# Patient Record
Sex: Female | Born: 1955 | Race: Black or African American | Hispanic: No | Marital: Single | Smoking: Never smoker
Health system: Southern US, Community
[De-identification: ages and names within clinical notes are randomized; demographics above are authoritative.]

## PROBLEM LIST (undated history)

## (undated) DIAGNOSIS — E669 Obesity, unspecified: Secondary | ICD-10-CM

## (undated) DIAGNOSIS — T7840XA Allergy, unspecified, initial encounter: Secondary | ICD-10-CM

## (undated) DIAGNOSIS — J849 Interstitial pulmonary disease, unspecified: Secondary | ICD-10-CM

## (undated) DIAGNOSIS — I10 Essential (primary) hypertension: Secondary | ICD-10-CM

## (undated) HISTORY — DX: Obesity, unspecified: E66.9

## (undated) HISTORY — PX: BUNIONECTOMY: SHX129

## (undated) HISTORY — DX: Essential (primary) hypertension: I10

## (undated) HISTORY — PX: OTHER SURGICAL HISTORY: SHX169

## (undated) HISTORY — DX: Interstitial pulmonary disease, unspecified: J84.9

## (undated) HISTORY — DX: Allergy, unspecified, initial encounter: T78.40XA

## (undated) HISTORY — PX: TUBAL LIGATION: SHX77

---
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
## 1998-06-30 ENCOUNTER — Other Ambulatory Visit: Admission: RE | Admit: 1998-06-30 | Discharge: 1998-06-30 | Payer: Self-pay | Admitting: Family Medicine

## 2005-03-04 ENCOUNTER — Other Ambulatory Visit: Admission: RE | Admit: 2005-03-04 | Discharge: 2005-03-04 | Payer: Self-pay | Admitting: Family Medicine

## 2006-08-05 ENCOUNTER — Other Ambulatory Visit: Admission: RE | Admit: 2006-08-05 | Discharge: 2006-08-05 | Payer: Self-pay | Admitting: Family Medicine

## 2007-07-06 HISTORY — PX: COLONOSCOPY: SHX174

## 2007-07-06 HISTORY — PX: LUNG BIOPSY: SHX232

## 2007-09-14 ENCOUNTER — Other Ambulatory Visit: Admission: RE | Admit: 2007-09-14 | Discharge: 2007-09-14 | Payer: Self-pay | Admitting: Family Medicine

## 2007-12-16 ENCOUNTER — Ambulatory Visit: Payer: Self-pay | Admitting: Pulmonary Disease

## 2007-12-16 ENCOUNTER — Ambulatory Visit: Payer: Self-pay | Admitting: Cardiology

## 2007-12-16 ENCOUNTER — Inpatient Hospital Stay (HOSPITAL_COMMUNITY): Admission: EM | Admit: 2007-12-16 | Discharge: 2007-12-30 | Payer: Self-pay | Admitting: Emergency Medicine

## 2007-12-18 ENCOUNTER — Encounter (INDEPENDENT_AMBULATORY_CARE_PROVIDER_SITE_OTHER): Payer: Self-pay | Admitting: Internal Medicine

## 2007-12-20 ENCOUNTER — Encounter: Payer: Self-pay | Admitting: Pulmonary Disease

## 2007-12-22 ENCOUNTER — Ambulatory Visit: Payer: Self-pay | Admitting: Thoracic Surgery

## 2007-12-25 ENCOUNTER — Encounter: Payer: Self-pay | Admitting: Thoracic Surgery

## 2007-12-25 ENCOUNTER — Ambulatory Visit: Payer: Self-pay | Admitting: Internal Medicine

## 2008-01-04 ENCOUNTER — Encounter: Admission: RE | Admit: 2008-01-04 | Discharge: 2008-01-04 | Payer: Self-pay | Admitting: Thoracic Surgery

## 2008-01-04 ENCOUNTER — Ambulatory Visit: Payer: Self-pay | Admitting: Thoracic Surgery

## 2008-01-08 ENCOUNTER — Telehealth: Payer: Self-pay | Admitting: Pulmonary Disease

## 2008-01-09 ENCOUNTER — Ambulatory Visit: Payer: Self-pay | Admitting: Pulmonary Disease

## 2008-01-09 DIAGNOSIS — I10 Essential (primary) hypertension: Secondary | ICD-10-CM | POA: Insufficient documentation

## 2008-01-09 DIAGNOSIS — J8409 Other alveolar and parieto-alveolar conditions: Secondary | ICD-10-CM | POA: Insufficient documentation

## 2008-01-18 ENCOUNTER — Ambulatory Visit: Payer: Self-pay | Admitting: Pulmonary Disease

## 2008-01-23 ENCOUNTER — Encounter: Payer: Self-pay | Admitting: Pulmonary Disease

## 2008-01-24 ENCOUNTER — Encounter: Admission: RE | Admit: 2008-01-24 | Discharge: 2008-01-24 | Payer: Self-pay | Admitting: Thoracic Surgery

## 2008-01-24 ENCOUNTER — Ambulatory Visit: Payer: Self-pay | Admitting: Pulmonary Disease

## 2008-01-24 ENCOUNTER — Ambulatory Visit: Payer: Self-pay | Admitting: Thoracic Surgery

## 2008-02-14 ENCOUNTER — Telehealth (INDEPENDENT_AMBULATORY_CARE_PROVIDER_SITE_OTHER): Payer: Self-pay | Admitting: *Deleted

## 2008-02-15 ENCOUNTER — Encounter: Payer: Self-pay | Admitting: Pulmonary Disease

## 2008-02-27 ENCOUNTER — Ambulatory Visit: Payer: Self-pay | Admitting: Pulmonary Disease

## 2008-04-17 ENCOUNTER — Encounter: Payer: Self-pay | Admitting: Internal Medicine

## 2008-04-19 ENCOUNTER — Ambulatory Visit: Payer: Self-pay | Admitting: Pulmonary Disease

## 2008-08-21 ENCOUNTER — Encounter: Payer: Self-pay | Admitting: Internal Medicine

## 2008-11-28 ENCOUNTER — Ambulatory Visit: Payer: Self-pay | Admitting: Surgery

## 2008-11-28 ENCOUNTER — Ambulatory Visit (HOSPITAL_COMMUNITY): Admission: RE | Admit: 2008-11-28 | Discharge: 2008-11-28 | Payer: Self-pay | Admitting: Rheumatology

## 2008-11-28 ENCOUNTER — Encounter (INDEPENDENT_AMBULATORY_CARE_PROVIDER_SITE_OTHER): Payer: Self-pay | Admitting: Rheumatology

## 2008-11-29 ENCOUNTER — Encounter: Admission: RE | Admit: 2008-11-29 | Discharge: 2008-11-29 | Payer: Self-pay | Admitting: Internal Medicine

## 2008-12-27 ENCOUNTER — Other Ambulatory Visit: Admission: RE | Admit: 2008-12-27 | Discharge: 2008-12-27 | Payer: Self-pay | Admitting: Family Medicine

## 2009-09-22 ENCOUNTER — Ambulatory Visit: Payer: Self-pay | Admitting: Family Medicine

## 2010-07-26 ENCOUNTER — Encounter: Payer: Self-pay | Admitting: Thoracic Surgery

## 2010-08-06 ENCOUNTER — Ambulatory Visit (INDEPENDENT_AMBULATORY_CARE_PROVIDER_SITE_OTHER): Payer: BC Managed Care – PPO | Admitting: Family Medicine

## 2010-08-06 DIAGNOSIS — I10 Essential (primary) hypertension: Secondary | ICD-10-CM

## 2010-08-06 DIAGNOSIS — Z79899 Other long term (current) drug therapy: Secondary | ICD-10-CM

## 2010-08-06 DIAGNOSIS — Z23 Encounter for immunization: Secondary | ICD-10-CM

## 2010-11-17 NOTE — Consult Note (Signed)
Jocelyn Bond, RALLIS          ACCOUNT NO.:  [id number]   MEDICAL RECORD NO.:  [id number]          PATIENT TYPE:  EMS   LOCATION:  ED                           FACILITY:  [facility]   PHYSICIAN:  Jonelle Sidle, MD DATE OF BIRTH:  July 06, 1955   DATE OF CONSULTATION:  12/16/2007  DATE OF DISCHARGE:                                 CONSULTATION   PRIMARY CARE PHYSICIAN:  Jethro Bastos, M.D. with [facility].   REASON FOR CONSULTATION:  Premature ventricular complexes and recent  episode of syncope.   HISTORY OF PRESENT ILLNESS:  Ms. Jocelyn Bond is a pleasant 55 year old  woman with a history of hypertension treated with medications outlined  below.  She states that she has been in her usual state of health except  that over the last few weeks she has been experiencing an intermittent  cough productive of thick white sputum, no obvious fevers or chills  but a general decrease in energy and relative anorexia.  She has been  using Mucinex and Tussin over-the-counter.  She states that she awoke  this morning to do her usual paper route and felt lightheaded when she  stood up.  This persisted but she went on into the kitchen and sat on a  barstool to eat some chips.  While she was seated there her daughter  apparently heard for her moaning and went in to check on her.  It was  noted that she was lying back with her head on the counter and not  responding to direct questions with some mild shaking of her hands.  Her daughter spoke to her and shook her some and within 30 seconds she  was reportedly back to normal.  Jocelyn Bond does not recall this  situation directly although does not remember having any associated  chest pain or palpitations preceding the event and now feels back to  normal.  She was taken to the [facility] emergency department and it  was noted that she had premature ventricular complexes on telemetry  monitoring including some couplets but no  sustained arrhythmias.  These  were asymptomatic.  Her 12-lead electrocardiogram is pending at this  time.  Available rhythm strip that I have shows no acute ST changes in  leads 1, 2 or 3.  She denies any personal history of cardiovascular  disease or dysrhythmia.  She has no typical exertional chest pain or  limiting dyspnea on any regular basis.  She was actually given a dose of  amiodarone in the emergency department prior to our assessment.  In  reviewing the situation further I see that she has a chest x-ray  demonstrating asymmetric air space disease, right greater than left,  suggestive of possible pneumonia.  She also has a leukocytosis with a  white blood cell count of 16 and somewhat of a leftward shift.  Her  potassium is low at 3.4 and her creatinine is mildly elevated at 1.3.  Point of care cardiac markers are entirely normal including a BNP level.   ALLERGIES:  No known drug allergies.   MEDICATIONS AT HOME:  1. Include Norvasc  5 mg p.o. daily.  2. Hydrochlorothiazide 25 mg p.o. daily.  3. Potassium chloride 40 mEq p.o. daily.   PAST MEDICAL HISTORY:  Includes hypertension.  The patient denies any  problems with hyperlipidemia, thyroid disease, diabetes mellitus or  cardiovascular disease.   FAMILY HISTORY:  Is reviewed and is significant for hypertension,  although no premature cardiovascular disease, dysrhythmia or sudden  cardiac death.   SOCIAL HISTORY:  The patient denies any tobacco or alcohol use.  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   REVIEW OF SYSTEMS:  As outlined above.  Otherwise negative.   PHYSICAL EXAMINATION:  VITAL SIGNS:  On examination blood pressure is  110/80, heart rate is in the 90s in sinus rhythm with occasional  premature ventricular complexes.  She is not hypoxic.  Respirations 18  and nonlabored.  GENERAL:  This is a normally nourished appearing woman in no  acute  distress.  HEENT:  Conjunctivae normal.  Oropharynx is clear.  NECK:  Neck is supple.  No elevated jugular venous pressure.  No loud  bruits.  No thyromegaly.  LUNGS:  Exhibit egophony essentially in the right mid base with crackles  in this region, less noted in the left base.  There is a slight  expiratory wheeze on the right.  CARDIAC:  Reveals a regular rate and rhythm with occasional ectopic  beat.  No S3 gallop, pericardial rub or loud systolic murmur.  ABDOMEN:  Soft, nontender, normoactive bowel sounds.  No guarding.  EXTREMITIES:  Exhibit no frank pitting edema.  Distal pulses are 2+.  SKIN:  Skin is warm and dry.  MUSCULOSKELETAL:  No kyphosis noted.  NEUROPSYCHIATRIC:  The patient is alert and oriented x3.  Affect is  appropriate.   LABORATORY DATA:  WBC 16.0, hemoglobin 10.8, hematocrit 32.4, platelets  295, sodium 138, potassium 3.4, chloride 101, glucose 123, BUN 10,  creatinine 1.3, CK-MB 1.4, troponin-I less than 0.05.  BNP 35.  Urinalysis with trace leukocytes, many squamous epithelial cells  suggesting contamination, 30 protein, no bacteria.   IMPRESSION:  1. Recent brief episode of apparent syncope while seated preceded by a      feeling of lightheadedness on standing.  This raises the      possibility of general orthostasis particularly with the patient's      concurrent pneumonia and relative anorexia over the last few weeks.      It may well be that she has been somewhat dehydrated also reflected      in her mild increase in creatinine.  Having said that she does have      some ventricular ectopy noted although nothing sustained.  Her BNP      would argue against any obvious congestive heart failure as a      component.  She has no personal history of cardiovascular disease      or myopathy.  2. History of hypertension, on Norvasc, hydrochlorothiazide and      potassium supplementation.  3. Apparent pneumonia with right greater than left-sided  infiltrates,      leukocytosis and a 2 week history of mildly productive cough.   RECOMMENDATIONS:  I spoke with the [facility] hospitalist on call today.  It  would seem reasonable for the patient to be admitted to telemetry not  only for initial management of her pneumonia but also closer followup of  her heart rhythm.  We can also obtain serial cardiac markers, a TSH,  check orthostatics and check a 2-D echocardiogram to  exclude any structural cardiac abnormalities.  Followup  electrocardiogram can also be obtained.  Our service will plan to follow  with you to determine if any additional cardiac studies are needed.  I  would not continue amiodarone.  Otherwise no other specific cardiac  medications are added at this point.      Jonelle Sidle, MD  Electronically Signed     SGM/MEDQ  D:  12/16/2007  T:  12/16/2007  Job:  [id number]   cc:   Jethro Bastos, M.D.  Fax: [phone]   Jola Baptist. Idol, P.A.

## 2010-11-17 NOTE — Discharge Summary (Signed)
Jocelyn Bond, Jocelyn Bond          ACCOUNT NO.:  [id number]   MEDICAL RECORD NO.:  [id number]          PATIENT TYPE:  INP   LOCATION:  1432                         FACILITY:  [facility]   PHYSICIAN:  Ines Bloomer, M.D. DATE OF BIRTH:  1956/04/22   DATE OF ADMISSION:  12/16/2007  DATE OF DISCHARGE:  12/25/2007                               DISCHARGE SUMMARY   DATE OF ANTICIPATED DISCHARGE:  December 30, 2007.   PRIMARY ADMITTING DIAGNOSIS:  Syncope.   ADDITIONAL/DISCHARGE DIAGNOSES:  1. Bronchiolitis obliterans-organizing pneumonia.  2. Questionable lupus.  3. Hypertension.  4. Urinary tract infection.  5. Mild postoperative anemia.   PROCEDURES PERFORMED:  1. Bronchoscopy with bronchoalveolar lavage from the left lower lobe      and transbronchial biopsy from the right lower lobe.  2. Right video-assisted thoracoscopic surgery with right lung biopsies      x3.   HISTORY:  The patient is a 55 year old female who presented on the date  of this admission complaining of dizziness and a syncopal episode.  She  related a 2-3-week history of cough with night sweats but no fevers.  She was brought to the emergency department at [facility] and  was subsequently admitted for further evaluation.   HOSPITAL COURSE:  Upon admission, the patient was noted to have frequent  PVCs and hypertension.  Cardiology consult was obtained, as it was not  clear the source of her syncope.  A 2-D echo was performed and this  showed normal left ventricular systolic function with an ejection  fraction of 55-65%.  Otherwise, her echo was unremarkable.  Also, her  chest x-ray showed bilateral pulmonary infiltrates and she was started  on Avelox for antibiotic coverage.  CT scan was performed and a followup  chest x-ray which showed progression of air space disease prominently in  the lower lobes.  On further workup, the patient was noted to have an  elevated sed rate as well as ANA.  She was seen by  pulmonology and  underwent a bedside bronchoscopy by Dr. Craige Cotta with biopsies and  bronchoalveolar lavage.  Results from her bronchoscopy were suggestive  of capillaritis and it was felt that further testing would be helpful.  Thoracic surgery consult was obtained and Dr. Edwyna Shell saw the patient and  reviewed the films.  He felt her best course of action would be to  proceed with a VATS with biopsies for diagnosis.  She was taken to the  operating room on June 22 and multiple right lung biopsies were  performed.  She tolerated the procedure well and was transferred to the  SICU for further observation.  From a pulmonary standpoint, she remained  stable and was able to be transferred to the floor on postop day #3.  Her postoperative course has been uneventful.  She has had no further  syncopal episodes and this was felt to be related to orthostasis  secondary to her pulmonary process.  Her chest tubes have been removed  in the usual fashion and followup chest x-rays have remained stable.  She did have a leukocytosis and urinalysis was positive for bacteria and  leukocytes and she was started empirically on Avelox.  At this point,  she has completed a 3-day course of Avelox and her white blood cell  count has trended back downward.  Her pathology from her lung biopsies  was positive for bronchiolitis obliterans-organizing pneumonia.  Given  this diagnosis along with a positive ANA, it is pulmonology opinion that  lupus is the possible etiology.  Thus far other SLE criteria are  negative including a negative DNA, normal renal function, normal 24-hour  urine protein collection, and the absence of photosensitivity arthritis,  rash, serositis, or CNS signs.  She has been started on prednisone 80 mg  daily.  HIV, hepatitis C, hepatitis B antigen, and PPDs have all been  negative.  Dr. Marchelle Gearing had a long discussion with the patient and her  family explaining the diagnosis and informed her that they  will  initially treat her with high doses of prednisone and then taper her  doses as an outpatient.  She may potentially need Cytoxan as an  outpatient and this will be followed by Dr. Craige Cotta.  She is still having  some O2 desaturations on room air and may need home O2.  This will be  determined at the time of discharge.  She is otherwise doing well.  Her  incisions were all healing well.  She has now been transferred to the  floor.  She is tolerating a regular diet and is having normal bowel and  bladder function.  She had been afebrile and her vital signs have been  stable.  Her most recent labs show hemoglobin 10.1, hematocrit 31.2,  white count 17.8, platelets 363, sodium 135, potassium 3.9, BUN 3, and  creatinine 0.86.  In fact she continues to remain stable and no other  acute changes occur, she will hopefully be ready for discharge home on  December 30, 2007.   DISCHARGE MEDICATIONS ARE AS FOLLOWS:  1. Prednisone 80 mg daily.  2. Tylox 1-2 q.4 h. p.r.n. for pain.  3. Amlodipine 5 mg daily.   DISCHARGE INSTRUCTIONS:  She was asked to refrain from driving, heavy  lifting, or strenuous activity.  She may continue ambulating daily and  using her Incentive spirometer.  She may shower daily and clean her  incisions with soap and water.  She will continue her same preoperative  diet.   DISCHARGE FOLLOW-UP:  She will see Dr. Edwyna Shell on July 2 with a chest x-  ray.  She will need to follow up as directed with Dr. Craige Cotta for titration  of her medications and followup on her diagnosis.  She will call in the  interim if she experiences any problems or has questions.      Coral Ceo, P.A.      Ines Bloomer, M.D.  Electronically Signed    GC/MEDQ  D:  12/29/2007  T:  12/29/2007  Job:  [id number]   cc:   Jethro Bastos, M.D.  Coralyn Helling, MD

## 2010-11-17 NOTE — Assessment & Plan Note (Signed)
OFFICE VISIT   Jocelyn Bond, Jocelyn Bond  DOB:  Nov 22, 1955                                        January 04, 2008  CHART #:  [id number]   The patient came today.  We removed her chest tube sutures.  Her chest x-  ray shows normal postoperative changes.  Her blood pressure is 123/85,  pulse 100, respirations 18, and sats were 96%.  Lungs were clear to  auscultation and percussion.  Overall, she is doing well.  Her diagnosis  is bronchiolitis obliterans, organized pneumonia, and she has been  followed by Dr. Craige Cotta.  I told her that she is to return to work in next  2-3 weeks, and we will fill out her family live.  We will see her back  again in 3 weeks.   Ines Bloomer, M.D.  Electronically Signed   DPB/MEDQ  D:  01/04/2008  T:  01/05/2008  Job:  [id number]

## 2010-11-17 NOTE — Consult Note (Signed)
[name], [name]          ACCOUNT NO.:  [id number]   MEDICAL RECORD NO.:  [id number]          PATIENT TYPE:  INP   LOCATION:  1432                         FACILITY:  [facility]   PHYSICIAN:  Coralyn Helling, MD        DATE OF BIRTH:  [birth year]   DATE OF CONSULTATION:  12/19/2007  DATE OF DISCHARGE:                                 CONSULTATION   REASON FOR CONSULTATION:  Bilateral pulmonary infiltrates.   HISTORY OF PRESENT ILLNESS:  This is a 55 year old female who was  admitted on June 13 with symptoms of cough and sweats for the last two  or three weeks.  She was also having occasional episodes of vomiting. On  the day of admission she was going to the bathroom when she felt dizzy  and had a syncopal event.  She was subsequent brought to the emergency  room for admission.  Cardiac evaluation thus far has been unremarkable.  Chest x-ray however, revealed bilateral infiltrates.  CT scan of the  chest demonstrated bilateral patchy infiltrates but was negative for  pulmonary embolism.  She was started on Avelox for treatment of possible  community acquired pneumonia. Of note, however, was that she was not  having much sputum production and she did not have any fever.  She says  that she has been noticing getting tired when she does any kind of  exertion but she denies any chest tightness or wheezing.  She has not  had any sinus symptoms.  She denies any skin rashes or joint pain or  swelling.  She says she has been having problems with hair loss but this  has been going on for quite some time. She has not had any abdominal  pain or diarrhea. She denies any difficulty swallowing or hoarseness.   PAST MEDICAL HISTORY:  Significant for hypertension.   OUTPATIENT MEDICATIONS:  1. Norvasc.  2. Hydrochlorothiazide.  3. Potassium chloride.   FAMILY HISTORY:  Significant for hypertension.   ALLERGIES:  She has no known drug allergies.   SOCIAL HISTORY:  She lives with her daughter.   There is no history of  tobacco or alcohol use.   REVIEW OF SYSTEMS:  Unremarkable except for stated above.   PHYSICAL EXAM:  GENERAL:  She is seen her hospital room.  She is awake,  alert, does not appear to be acute distress.  VITAL SIGNS:  Blood pressure is 108/68, heart rate 75, respiratory rate  18, temperature is 98.7, oxygen saturation 95% on room air.  HEENT:  Pupils reactive.  Extraocular movements intact. There is no  sinus tenderness.  There is no nasal discharge.  There are no oral  lesions, there is no lymphadenopathy, no thyromegaly, no jugular venous  distention.  HEART:  S1 and S2.  Regular rhythm without any murmur.  CHEST:  She had good air entry.  There is faint crackles heard more  prominent at the bases.  There is no wheezing.  ABDOMEN:  Abdomen was soft, nontender, positive bowel sounds.  EXTREMITIES:  There was no edema, cyanosis or clubbing.  NEUROLOGIC EXAM:  She had normal strength and  no focal deficits were  appreciated.   LABORATORY DATA:  White blood cells 14.3, hemoglobin 10.6, hematocrit  31.6, platelet count 341. BNP is 96.  HIV was negative.  TSH is 1.43.  Sodium is 137, potassium 3.8, chloride is 99, CO2 is 30.  BUN is 80,  creatinine is 1, glucose 102.  ESR is 95, rheumatoid factor is 26.  ANA  was positive with 1:320 in a speckled pattern.   Chest x-ray from June 15 showed progression of her bilateral pulmonary  infiltrates.   IMPRESSION:  Bilateral pulmonary infiltrates with an increased sed rate  and increase in her ANA. I am less suspicious if this is a infectious  process.  I am more concerned that she may have a underlying connective  tissue disease, although outside of her radiographic findings with her  lungs, she does not appear to have any other significant symptoms.  Further rheumatologic evaluation is being conducted by her primary care  team.  What I would like to do is schedule her for bronchoscopy for  airway sampling to exclude  atypical infection as well as to obtain  tissue sampling to assess the possibility of cryptogenic organized  pneumonia or sarcoidosis.  I would also like to have a PPD placed to  exclude previous exposure to tuberculosis.  Depending upon the results  of this she may need to be started on systemic corticosteroids.  Further  recommendations will be made after she undergoes her bronchoscopy.  I  have explained the procedure of the bronchoscopy to the patient.  I have  scheduled her for this for 10:00 a.m. on June 17. The risks of the  procedure were detailed as bleeding, infection, pneumothorax and  nondiagnosis. If her clinical course worsens or radiographic appearance  worsens and bronchoscopy is unrevealing she may also need to have  evaluation by thoracic surgery for possible video-assisted thoracoscopy  with open lung biopsy.  However, I do not think that she would need this  at the present time.      Coralyn Helling, MD  Electronically Signed     VS/MEDQ  D:  12/19/2007  T:  12/19/2007  Job:  [id number]

## 2010-11-17 NOTE — Consult Note (Signed)
Jocelyn Bond, Jocelyn Bond          ACCOUNT NO.:  [id number]   MEDICAL RECORD NO.:  [id number]          PATIENT TYPE:  INP   LOCATION:  1432                         FACILITY:  [facility]   PHYSICIAN:  Ines Bloomer, M.D. DATE OF BIRTH:  1955-07-29   DATE OF CONSULTATION:  DATE OF DISCHARGE:                                 CONSULTATION   REASON FOR CONSULTATION:  Shortness of breath and syncopal episode.   HISTORY OF PRESENT ILLNESS:  This 55 year old African American female  has a history of hypertension and had an episode of cough, decrease in  energy, and anorexia but no fever or chills.  She became lethargic and  had a syncopal episode, was brought to the emergency room and admitted.  Cardiac workup was unremarkable.  Chest x-ray and subsequent CT scan  showed bilateral pulmonary infiltrates, right greater than left.  Her  bronchoscopy by Dr. Craige Cotta revealed possible capillaritis.  She has a  family history of sarcoidosis as well as some type of pneumonitis.   PAST MEDICAL HISTORY:  Significant for hypertension.   MEDICATIONS:  She takes Norvasc 5 mg a day, hydrochlorothiazide 25 mg a  day, and potassium chloride in the hospital.   OTHER MEDICATIONS:  Include Delsym, Protonix, and guaifenesin.  She has  been put on sliding scale.  She is on heparin injections, Avelox,  Zofran, and Ventolin.   FAMILY HISTORY:  Significant for hypertension as well as sarcoidosis and  pneumonitis.   SOCIAL HISTORY:  She does not smoke or drink.  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   REVIEW OF SYSTEMS:  CARDIAC:  No angina or atrial fibrillation, but has  had the arrhythmias in the emergency room.  PULMONARY:  No hemoptysis.  See history of present illness.  GI:  No nausea, vomiting, constipation,  or diarrhea but has had anorexia.  GU:  No dysuria, frequent urination,  or kidney disease.  VASCULAR:  No claudication, DVT, or TIAs.  NEUROLOGIC:  No dizziness,  headaches, black outs, or seizures, but has  had the syncopal episode.  MUSCULOSKELETAL:  No joint pains or rash.  HEMATOLOGICAL:  No problems with anemia or clotting disorders.  PSYCHIATRIC:  No psychiatric illnesses.  EYE/ENT:  No changes in her  eyes or hearing.   PHYSICAL EXAMINATION:  GENERAL:  She is an obese [place]  female, in no acute distress.  VITAL SIGNS:  Blood pressure is 120/80, pulse 80, respirations 18, and  sats were 94%.  HEAD, EYES, EARS, NOSE, AND THROAT:  Unremarkable.  NECK:  Supple without thyromegaly.  CHEST:  Clear to auscultation and percussion.  HEART:  Regular sinus rhythm.  No murmurs.  ABDOMEN:  Soft.  There is no hepatosplenomegaly.  Bowel sounds normal.  EXTREMITIES:  Pulses are 2+.  There is no clubbing or edema.  NEUROLOGICAL:  She is oriented x3.  Sensory and motor intact.  Cranial  nerves intact.   IMPRESSION:  1. Bilateral pulmonary infiltrates, right greater than left, rule out      interstitial lung disease.  2. Hypertension.  3. Syncopal episode.  PLAN:  Right wedge lung biopsy x2.      Ines Bloomer, M.D.  Electronically Signed     DPB/MEDQ  D:  12/22/2007  T:  12/23/2007  Job:  [id number]

## 2010-11-17 NOTE — H&P (Signed)
Jocelyn Bond, Jocelyn Bond          ACCOUNT NO.:  [id number]   MEDICAL RECORD NO.:  [id number]          PATIENT TYPE:  INP   LOCATION:  1432                         FACILITY:  [facility]   PHYSICIAN:  Michiel Cowboy, MDDATE OF BIRTH:  20-Jun-1956   DATE OF ADMISSION:  12/16/2007  DATE OF DISCHARGE:                              HISTORY & PHYSICAL   PRIMARY CARE Jeannelle Wiens:  Jethro Bastos, M.D.   Patient is a 55 year old female with a history of hypertension.  Reports  for the past one month she has been having cough, occasional night  sweats.  No fevers.  Occasional vomiting.  The patient actually vomited  yesterday.  This morning, she got up early to go to her work.  She went  to the bathroom and felt lightheaded.  Shortly thereafter, went to the  kitchen to eat breakfast, and that was the last thing she remembered.  Her daughter found her soon thereafter, shaking with her upper  extremities in a jerking-like motion, moaning, staring into space,  apparently unresponsive for about 30 seconds or so.  The patient quickly  regained consciousness and started to recollect some of the events and  was back to her baseline rapidly after this event.  Then she wanted to  go to work, but her daughter stopped her and told her she needs to be  further evaluated.  Patient presented to the emergency department, where  she was noted to have frequent PVCs and relative hypertension.  Initially, a cardiology consult was called.  Per evaluation, this  current event was not clearly cardiac.  They went ahead and called [facility]  Hospitalists for further admission but agreed to continue following.  We  will arrange for echocardiogram done, given PVCs the patient is having.  Of note, her chest x-ray did show pneumonia, which is possibly  contributing to current events.   PAST MEDICAL HISTORY:  Significant for hypertension.   SOCIAL HISTORY:  Patient denies ever smoking.  Does not use drugs.  Does  not  drink.  Lives with her daughter.   FAMILY HISTORY:  Significant for high blood pressure.  No early coronary  artery disease.   ALLERGIES:  No known drug allergies.   MEDICATIONS:  1. Norvasc 5 mg p.o. daily.  2. Hydrochlorothiazide 25 mg p.o. daily.  3. KCL 40 mEq p.o. daily.   REVIEW OF SYSTEMS:  Patient denies chest pain, shortness of breath.  Does endorse occasional night sweats but no fevers.  Have intentional  loss of weight of about a couple of pounds or so.  Somewhat decreased  appetite.  Otherwise, review of systems unremarkable.   PHYSICAL EXAMINATION:  VITALS:  Initial blood pressure 112/55, heart  rate 93, respirations 17, temperature 97.6, satting at 99% on 2 liters.  Patient was noted to have her blood pressure go down to the high 90s but  then with some fluids, improved back to 114 prior to her going upstairs.  Patient is a slightly obese female lying on the bed in no acute  distress.  Head atraumatic.  Moist mucous membranes.  LUNGS:  Decreased breath sounds on the right,  more than the left.  Occasional crackles bilaterally.  HEART:  Regular rate and rhythm.  No murmurs, rubs or gallops.  ABDOMEN:  Soft, nontender, nondistended.  LOWER EXTREMITIES:  Without edema.  NEUROLOGIC:  Intact.   LABS:  White blood cell count 16, hemoglobin 10.8, platelets 295.  Sodium 138, potassium 3.4, creatinine 1.3.  D-dimer elevated at 1.27.  Cardiac enzymes negative x1.  BNP unremarkable.   EKG showing initially poor quality but on repeat showed normal sinus  rhythm at a rate of 88.  At this time, no PVCs noted.   Chest x-ray showing bilateral infiltrates, right worse than left,  suggestive of pneumonia.   Patient in ED received amiodarone as well as Rocephin and Zithromax.   ASSESSMENT/PLAN:  1. Syncope with possible seizure-like activity, although doubt true      seizure, given that the patient had very quick resolution, no loss      of urine, no postictal state.  Did not  have generalized      tonic/clonic movements or tongue-biting.  Her CK's within normal      limits.  As per cardiology, given the increase of PVCs in the      beginning, will get a 2D echo.  TSH will place the patient on      telemetry, cycle cardiac enzymes x3, although the patient is      completely chest painfree, not complaining of any shortness of      breath.  Will also further risk stratify with hemoglobin A1C and      fasting lipid panel in the morning.  2. Pneumonia:  Will obtain sputum culture and blood culture.  Place      patient on Avelox for atypical coverage.  Given Guaifenesin as      needed.  Given that the patient has somewhat decreased lymphocyte      count, will check HIV serologies, although this could be a normal      variant.  3. Dehydration:  IV fluids.  Follow creatinine.  4. Hypokalemia:  Will replete.  5. History of hypertension:  Hold BP meds.  6. Increased D-dimer:  Patient does not have any chest pain or      shortness of breath but did have a syncopal event, although this is      not a typical presentation of a pulmonary embolus, if her      creatinine improves, will get a CT scan of her chest to rule out      pulmonary embolus.  7. Prophylaxis:  Lovenox and Protonix.      Michiel Cowboy, MD  Electronically Signed     AVD/MEDQ  D:  12/16/2007  T:  12/16/2007  Job:  [id number]   cc:   Jethro Bastos, M.D.  Fax: [phone]

## 2010-11-17 NOTE — Op Note (Signed)
Jocelyn Bond, GODETTE          ACCOUNT NO.:  [id number]   MEDICAL RECORD NO.:  [id number]          PATIENT TYPE:  INP   LOCATION:  1432                         [facility]ACILITY:  [facility]   PHYSICIAN:  Coralyn Helling, MD        DATE OF BIRTH:  22-Jun-1956   DATE OF PROCEDURE:  12/20/2007  DATE OF DISCHARGE:                               OPERATIVE REPORT   PROCEDURE:  Bronchoscopy with bronchoalveolar lavage from the left lower  lobe and transbronchial biopsy from the right lower lobe.   PREOPERATIVE DIAGNOSIS:  Rule out sarcoidosis, rule out atypical  infection.   POSTOPERATIVE DIAGNOSIS:  Rule out sarcoidosis, rule out atypical  infection.   INDICATIONS:  [name] 55 year old female who was admitted once  [facility] on June 15 with syncope. Initial chest x-ray showed  patchy infiltrates bilaterally, more prominent in lower lobes.  She was  initially treated empirically with Avelox for presumptive pneumonia.  However, she did not have any fever or significant increase in white  cell count.  Follow-up CT scan of the chest and chest x-ray showed  progression of her ear space disease.  Again, more prominently lower  lobes.  In addition she had a significant increase in her erythrocyte  sedimentation rate as well as her ANA.  She is scheduled to undergo  bronchoscopy for further evaluation of her lung infiltrates.  The  procedure was explained to the patient.  Risks were detailed as  bleeding, pneumothorax, infection, and non diagnosis.  Consent form was  signed.   DESCRIPTION OF PROCEDURE:  The patient was brought to the bronchoscopy  suite.  She was given a total of 6 mg of Versed and 100 mcg of fentanyl  intravenously for sedation and analgesia.  The bronchoscope was entered  orally.  The vocal cords visualized and appear to have normal motion.  A  total of 16 mL of 2% lidocaine was instilled as needed for topical  anesthesia.  The bronchoscope was entered into the trachea.   The carina  was visualized.  There did not appear to be any significant widening of  carina.  The mucosa appeared friable with easy bleeding.  The  bronchoscope was entered at the left main bronchus.  The left upper  lingular and lower lobe orifices were visualized.  There is no obvious  endobronchial lesions and 120 mL of saline was instilled to the left  lower lobe with approximately 25 mL of cloudy white fluid returned.   Focus was then switched to the right main bronchus.  The right upper,  middle and lower lobe orifices were visualized.  There is no obvious  endobronchial lesions.  Then using  fluoroscopic guidance,  transbronchial biopsy was obtained from the right lower lobe.  There was  a minimal amount of bleeding from the biopsy of the superior segment of  the right lower lobe which resolved after the installation of 10 mL of  iced saline.  The airways were completely inspected again and there were  no signs of continued bleeding.  The bronchoscope was then withdrawn.  The patient tolerated the procedure reasonably well  otherwise.  The  specimens will be sent for cytology, microbiology and surgical  pathology.  A postprocedure chest x-ray is pending at this time.     Coralyn Helling, MD  Electronically Signed    VS/MEDQ  D:  12/20/2007  T:  12/20/2007  Job:  [id number]

## 2010-11-17 NOTE — Op Note (Signed)
NAMESHASHA, BUCHBINDER          ACCOUNT NO.:  [id number]   MEDICAL RECORD NO.:  [id number]          PATIENT TYPE:  INP   LOCATION:  2310                         FACILITY:  [facility]   PHYSICIAN:  Ines Bloomer, M.D. DATE OF BIRTH:  January 23, 1956   DATE OF PROCEDURE:  DATE OF DISCHARGE:                               OPERATIVE REPORT   PREOPERATIVE DIAGNOSES:  1. Bilateral pulmonary infiltrates.  2. Possible interstitial lung disease.   POSTOPERATIVE DIAGNOSES:  1. Bilateral pulmonary infiltrates.  2. Possible interstitial lung disease.   OPERATION PERFORMED:  Right lung biopsy x3.   SURGEON:  Ines Bloomer, MD.   FIRST ASSISTANT:  Doree Fudge, PA-C.   General anesthesia.   After percutaneous insertion of all monitoring lines, the patient  underwent general anesthesia.  She was prepped and draped in the usual  sterile manner.  The patient was turned to the right lateral thoracotomy  position.  Dual-lumen tube was inserted.  The right lung was inflated  and deflated for each trocar sites.  We made one in the midaxillary line  at the fifth intercostal space.  The anterior axillary line in the  seventh intercostal space and the posterior axillary line at the seventh  intercostal space.  Three trocars were inserted.  0-degree scope was  inserted.  Pictures were taken of the lesion and it looked to be  inflamed in the right lower lobe.  It had some large interthoracic lymph  nodes.  Using the [facility] #1 ring forceps, the serial fragment was  grasped and then come in from the inferior trocar sites.  A lung biopsy  was done with two applications of the Covidien 60 stapler.  Part of this  was sent for culture and then the rest was sent for frozen section, and  the lateral basilar segment was grasped with the ring forceps and again  in like manner, this was resected with two applications of the Covidien  stapler.  The lateral segment of the middle lobe was grasped, the tip  was grasped, and this was resected with two applications of the Covidien  stapler.  Chest tube was brought to the anterior trocar sites and tied  in place in the usual fashion.  On-Q was placed inferiorly in the usual  fashion.  Marcaine block was done in the usual fashion and two trocar  sites closed with 3-0 Vicryl and Dermabond for the skin.  The lung was  re-expanded.  The patient returned to the recovery room in stable  condition.      Ines Bloomer, M.D.  Electronically Signed     DPB/MEDQ  D:  12/25/2007  T:  12/26/2007  Job:  [id number]

## 2010-11-17 NOTE — Assessment & Plan Note (Signed)
OFFICE VISIT   Jocelyn Bond, Jocelyn Bond  DOB:  01/25/1956                                        January 24, 2008  CHART #:  [id number]   REASON FOR OFFICE VISIT:  Followup appointment status post right lung  biopsy x3 by Dr. Edwyna Shell on December 16, 2007.   HISTORY OF PRESENTING ILLNESS:  This is a 55 year old African American  female with a past medical history of hypertension who originally  presented with symptoms of cough, decreased energy, and anorexia.  She  became lethargic and had a syncopal episode, and was brought to the  emergency room for further evaluation and treatment.  A cardiac workup  was essentially unremarkable.  A chest x-ray and subsequent CAT scan  showed bilateral pulmonary infiltrates, right greater than left.  The  patient underwent a bronchoscopy by Dr. Craige Cotta, which revealed possible  capillaritis.  The patient then underwent the aforementioned right lung  surgery with Dr. Edwyna Shell.  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  The patient is currently taking  prednisone 60 mg and she does have a followup appointment to see Dr.  Craige Cotta this afternoon.  The patient's only complaint is occasional  tenderness in the right anterior chest tube site.  She denies any  shortness of breath, chest pain, fever, chills, or night sweats.   PHYSICAL EXAMINATION:  GENERAL:  This is a pleasant 55 year old [place]  American female who is in no acute distress who is alert, oriented, and  cooperative.  VITAL SIGNS:  BP is 145/96, heart rate 74, respirations 18, and O2 sat  98%.  CARDIOVASCULAR:  Regular rate and rhythm.  PULMONARY:  Slightly diminished at the right base, otherwise clear.  No  rales, wheezes, or rhonchi.  Chest tube suture sites clean, dry, and  well healed.  EXTREMITIES:  No cyanosis, clubbing, or edema.   Chest x-ray done today shows interval improvement of the aeration  bilaterally.  There is some residual scar  in the right lower lobe.   IMPRESSION AND PLAN:  1. Bronchiolitis obliterans-organizing pneumonia.  2. The patient as previously stated has an appointment to see Dr.      Craige Cotta, who will continue to follow her long term.  She was      instructed she may return to work on February 19, 2008.  She will see      Dr. Edwyna Shell on a p.r.n. basis.  She is to call for any further      questions or concerns.   Ines Bloomer, M.D.  Electronically Signed   DZ/MEDQ  D:  01/24/2008  T:  01/25/2008  Job:  [id number]   cc:   Coralyn Helling, MD

## 2011-04-01 LAB — TYPE AND SCREEN
ABO/RH(D): A NEG
Antibody Screen: POSITIVE
Antibody Screen: POSITIVE
DAT, IgG: NEGATIVE
Donor AG Type: NEGATIVE
Donor AG Type: NEGATIVE
PT AG Type: NEGATIVE

## 2011-04-01 LAB — CBC
HCT: 27.9 — ABNORMAL LOW
HCT: 29.7 — ABNORMAL LOW
HCT: 30.6 — ABNORMAL LOW
HCT: 30.9 — ABNORMAL LOW
HCT: 31 — ABNORMAL LOW
HCT: 31.2 — ABNORMAL LOW
HCT: 31.7 — ABNORMAL LOW
Hemoglobin: 10.1 — ABNORMAL LOW
Hemoglobin: 10.8 — ABNORMAL LOW
Hemoglobin: 10.1 — ABNORMAL LOW
Hemoglobin: 10.1 — ABNORMAL LOW
Hemoglobin: 10.2 — ABNORMAL LOW
Hemoglobin: 10.3 — ABNORMAL LOW
Hemoglobin: 10.3 — ABNORMAL LOW
Hemoglobin: 10.6 — ABNORMAL LOW
Hemoglobin: 9.1 — ABNORMAL LOW
Hemoglobin: 9.3 — ABNORMAL LOW
Hemoglobin: 9.6 — ABNORMAL LOW
MCHC: 33.2
MCHC: 33.5
MCHC: 32.6
MCHC: 33
MCV: 81.1
MCV: 81.8
MCV: 80.3
MCV: 80.8
MCV: 81.1
MCV: 81.5
Platelets: 319
Platelets: 327
Platelets: 357
Platelets: 358
Platelets: 390
Platelets: 406 — ABNORMAL HIGH
RBC: 3.73 — ABNORMAL LOW
RBC: 3.5 — ABNORMAL LOW
RBC: 3.77 — ABNORMAL LOW
RBC: 3.81 — ABNORMAL LOW
RBC: 3.85 — ABNORMAL LOW
RBC: 3.89
RDW: 15.2
RDW: 14.7
RDW: 15
RDW: 15.2
RDW: 15.2
RDW: 15.3
RDW: 15.5
WBC: 12.6 — ABNORMAL HIGH
WBC: 13 — ABNORMAL HIGH
WBC: 13.1 — ABNORMAL HIGH
WBC: 13.6 — ABNORMAL HIGH
WBC: 14.3 — ABNORMAL HIGH
WBC: 17.8 — ABNORMAL HIGH
WBC: 18.3 — ABNORMAL HIGH
WBC: 19.2 — ABNORMAL HIGH

## 2011-04-01 LAB — AFB CULTURE WITH SMEAR (NOT AT ARMC): Acid Fast Smear: NONE SEEN

## 2011-04-01 LAB — LIPID PANEL
Cholesterol: 136
HDL: 24 — ABNORMAL LOW
Triglycerides: 46

## 2011-04-01 LAB — DIFFERENTIAL
Basophils Absolute: 0.1
Basophils Absolute: 0
Basophils Relative: 0
Basophils Relative: 1
Eosinophils Absolute: 0.5
Eosinophils Relative: 5
Lymphocytes Relative: 12
Lymphocytes Relative: 12
Lymphs Abs: 1.7
Monocytes Absolute: 2.1 — ABNORMAL HIGH
Monocytes Absolute: 1.6 — ABNORMAL HIGH
Neutro Abs: 11.7 — ABNORMAL HIGH
Neutro Abs: 10.5 — ABNORMAL HIGH
Neutro Abs: 8.7 — ABNORMAL HIGH
Neutrophils Relative %: 67

## 2011-04-01 LAB — POCT I-STAT, CHEM 8
BUN: 10
Creatinine, Ser: 1.3 — ABNORMAL HIGH
Glucose, Bld: 123 — ABNORMAL HIGH
Hemoglobin: 11.2 — ABNORMAL LOW
Potassium: 3.4 — ABNORMAL LOW
TCO2: 28

## 2011-04-01 LAB — COMPREHENSIVE METABOLIC PANEL
ALT: 25
ALT: 25
ALT: 33
AST: 27
Albumin: 2 — ABNORMAL LOW
Albumin: 2.1 — ABNORMAL LOW
Alkaline Phosphatase: 58
BUN: 6
BUN: 8
CO2: 26
CO2: 27
CO2: 26
CO2: 27
Calcium: 7.7 — ABNORMAL LOW
Calcium: 8.1 — ABNORMAL LOW
Calcium: 7.9 — ABNORMAL LOW
Calcium: 8.2 — ABNORMAL LOW
Chloride: 106
Creatinine, Ser: 0.89
Creatinine, Ser: 0.91
Creatinine, Ser: 0.98
GFR calc Af Amer: >60
GFR calc non Af Amer: >60
GFR calc non Af Amer: >60
GFR calc non Af Amer: 60 — ABNORMAL LOW
GFR calc non Af Amer: >60
Glucose, Bld: 105 — ABNORMAL HIGH
Glucose, Bld: 113 — ABNORMAL HIGH
Glucose, Bld: 151 — ABNORMAL HIGH
Glucose, Bld: 99
Potassium: 3.8
Sodium: 134 — ABNORMAL LOW
Sodium: 138
Total Bilirubin: 0.6
Total Protein: 6.3

## 2011-04-01 LAB — BASIC METABOLIC PANEL
BUN: 3 — ABNORMAL LOW
BUN: 5 — ABNORMAL LOW
BUN: 6
Calcium: 8 — ABNORMAL LOW
Calcium: 8.2 — ABNORMAL LOW
Calcium: 8.3 — ABNORMAL LOW
Chloride: 106
Creatinine, Ser: 0.86
Creatinine, Ser: 0.98
GFR calc Af Amer: >60
GFR calc non Af Amer: 58 — ABNORMAL LOW
GFR calc non Af Amer: 60 — ABNORMAL LOW
GFR calc non Af Amer: >60
Glucose, Bld: 102 — ABNORMAL HIGH
Glucose, Bld: 107 — ABNORMAL HIGH
Glucose, Bld: 91
Glucose, Bld: 98
Potassium: 3.7
Sodium: 137
Sodium: 137

## 2011-04-01 LAB — FUNGUS CULTURE W SMEAR: Fungal Smear: NONE SEEN

## 2011-04-01 LAB — URINE CULTURE: Colony Count: >=100000

## 2011-04-01 LAB — CULTURE, BLOOD (ROUTINE X 2): Culture: NO GROWTH

## 2011-04-01 LAB — CARDIAC PANEL(CRET KIN+CKTOT+MB+TROPI)
CK, MB: 0.9
Relative Index: 0.7
Troponin I: 0.01

## 2011-04-01 LAB — POCT I-STAT 3, ART BLOOD GAS (G3+)
Acid-Base Excess: 3 — ABNORMAL HIGH
O2 Saturation: 95
TCO2: 28
pCO2 arterial: 39.7
pO2, Arterial: 72 — ABNORMAL LOW

## 2011-04-01 LAB — PREPARE RBC (CROSSMATCH)

## 2011-04-01 LAB — D-DIMER, QUANTITATIVE: D-Dimer, Quant: 1.27 — ABNORMAL HIGH

## 2011-04-01 LAB — C4 COMPLEMENT
Complement C4, Body Fluid: 35
Complement C4, Body Fluid: 42

## 2011-04-01 LAB — C3 COMPLEMENT: C3 Complement: 203 — ABNORMAL HIGH

## 2011-04-01 LAB — CK TOTAL AND CKMB (NOT AT ARMC)
CK, MB: 0.9
Relative Index: 0.7
Total CK: 132

## 2011-04-01 LAB — POCT I-STAT 4, (NA,K, GLUC, HGB,HCT)
Glucose, Bld: 119 — ABNORMAL HIGH
HCT: 33 — ABNORMAL LOW
Hemoglobin: 11.2 — ABNORMAL LOW
Potassium: 3.9
Sodium: 137

## 2011-04-01 LAB — SEDIMENTATION RATE: Sed Rate: 95 — ABNORMAL HIGH

## 2011-04-01 LAB — EXTRACTABLE NUCLEAR ANTIGEN ANTIBODY: SSB (La) (ENA) Antibody, IgG: <0.2 AI (ref <1.0)

## 2011-04-01 LAB — CREATININE CLEARANCE, URINE, 24 HOUR: Urine Total Volume-CRCL: 625

## 2011-04-01 LAB — CULTURE, RESPIRATORY W GRAM STAIN

## 2011-04-01 LAB — URINALYSIS, ROUTINE W REFLEX MICROSCOPIC
Glucose, UA: NEGATIVE
Nitrite: NEGATIVE
Specific Gravity, Urine: 1.022
Urobilinogen, UA: 1
pH: 5.5

## 2011-04-01 LAB — POCT CARDIAC MARKERS: CKMB, poc: 1.4

## 2011-04-01 LAB — HIV-1 RNA ULTRAQUANT REFLEX TO GENTYP+: HIV-1 RNA Quant, Log: <1.7 (ref <1.70)

## 2011-04-01 LAB — URINE MICROSCOPIC-ADD ON

## 2011-04-01 LAB — GLOMERULAR BASEMENT MEMBRANE ANTIBODIES: GBM Ab: <3

## 2011-04-01 LAB — IRON AND TIBC
Iron: 12 — ABNORMAL LOW
Saturation Ratios: 7 — ABNORMAL LOW
TIBC: 182 — ABNORMAL LOW
UIBC: 170

## 2011-04-01 LAB — ANTI-NUCLEAR AB-TITER (ANA TITER)

## 2011-04-01 LAB — PROTEIN, URINE, 24 HOUR
Protein, 24H Urine: 138 — ABNORMAL HIGH
Urine Total Volume-UPROT: 625

## 2011-04-01 LAB — HEPATITIS C ANTIBODY: HCV Ab: NEGATIVE

## 2011-04-01 LAB — HIV 1/2 CONFIRMATION
HIV-1 antibody: NEGATIVE
HIV-2 Ab: NEGATIVE

## 2011-04-01 LAB — COMPLEMENT, TOTAL: Compl, Total (CH50): 63 U/mL (ref 31–66)

## 2011-04-01 LAB — ANGIOTENSIN CONVERTING ENZYME: Angiotensin-Converting Enzyme: 21 U/L (ref 9–67)

## 2011-04-01 LAB — RHEUMATOID FACTOR: Rhuematoid fact SerPl-aCnc: 26 — ABNORMAL HIGH

## 2011-04-01 LAB — HIV-1 RNA, QUALITATIVE, TMA: HIV-1 RNA, Qualitative, TMA: NOT DETECTED

## 2011-09-14 ENCOUNTER — Encounter: Payer: Self-pay | Admitting: Internal Medicine

## 2011-09-14 LAB — HM MAMMOGRAPHY: HM Mammogram: NEGATIVE

## 2011-11-08 ENCOUNTER — Encounter: Payer: Self-pay | Admitting: Internal Medicine

## 2011-11-08 ENCOUNTER — Telehealth: Payer: Self-pay | Admitting: Internal Medicine

## 2011-11-08 MED ORDER — AMLODIPINE BESYLATE 5 MG PO TABS: 5.0000 mg | ORAL_TABLET | Freq: Every day | ORAL | Status: DC

## 2011-11-08 NOTE — Telephone Encounter (Signed)
Pt was told she needed to make an appt to get anymore refills but since pt is about out of med. She will get a 30 day supply of it.   Pt has an appt on Thursday 5/9 at 2 for med check

## 2011-11-11 ENCOUNTER — Ambulatory Visit (INDEPENDENT_AMBULATORY_CARE_PROVIDER_SITE_OTHER): Payer: BC Managed Care – PPO | Admitting: Family Medicine

## 2011-11-11 ENCOUNTER — Encounter: Payer: Self-pay | Admitting: Family Medicine

## 2011-11-11 VITALS — BP 114/70 | HR 71 | Wt 233.0 lb

## 2011-11-11 DIAGNOSIS — E669 Obesity, unspecified: Secondary | ICD-10-CM

## 2011-11-11 DIAGNOSIS — I10 Essential (primary) hypertension: Secondary | ICD-10-CM

## 2011-11-11 DIAGNOSIS — Z79899 Other long term (current) drug therapy: Secondary | ICD-10-CM

## 2011-11-11 LAB — COMPREHENSIVE METABOLIC PANEL
AST: 24 U/L (ref 0–37)
Albumin: 3.6 g/dL (ref 3.5–5.2)
Alkaline Phosphatase: 79 U/L (ref 39–117)
BUN: 12 mg/dL (ref 6–23)
Potassium: 4.2 mEq/L (ref 3.5–5.3)
Sodium: 138 mEq/L (ref 135–145)
Total Protein: 7 g/dL (ref 6.0–8.3)

## 2011-11-11 LAB — CBC WITH DIFFERENTIAL/PLATELET
Basophils Absolute: 0.1 10*3/uL (ref 0.0–0.1)
Basophils Relative: 1 % (ref 0–1)
MCHC: 32.4 g/dL (ref 30.0–36.0)
Neutro Abs: 3.6 10*3/uL (ref 1.7–7.7)
Neutrophils Relative %: 44 % (ref 43–77)
RDW: 15.2 % (ref 11.5–15.5)

## 2011-11-11 LAB — LIPID PANEL
HDL: 52 mg/dL (ref >39)
LDL Cholesterol: 113 mg/dL — ABNORMAL HIGH (ref 0–99)

## 2011-11-11 MED ORDER — SPIRONOLACTONE 25 MG PO TABS: 25.0000 mg | ORAL_TABLET | Freq: Every day | ORAL | Status: DC

## 2011-11-11 MED ORDER — AMLODIPINE BESYLATE 5 MG PO TABS: 5.0000 mg | ORAL_TABLET | Freq: Every day | ORAL | Status: DC

## 2011-11-11 NOTE — Progress Notes (Signed)
  Subjective:    Patient ID: Jocelyn Bond, female    DOB: 06/01/1956, 56 y.o.   MRN: [id number]  HPI She is here for a medication recheck. She has not been seen in over a year. She continues on medications listed in the chart. She has started an exercise program. She is up-to-date on her routine medical care as well as preventive medicine issues. She has no other concerns or complaints.   Review of Systems Negative except as above    Objective:   Physical Exam alert and in no distress. Tympanic membranes and canals are normal. Throat is clear. Tonsils are normal. Neck is supple without adenopathy or thyromegaly. Cardiac exam shows a regular sinus rhythm without murmurs or gallops. Lungs are clear to auscultation.        Assessment & Plan:   1. Hypertension  CBC with Differential, Comprehensive metabolic panel  2. Obesity (BMI 30-39.9)  Lipid panel  3. Encounter for long-term (current) use of other medications  CBC with Differential, Comprehensive metabolic panel, Lipid panel   I discussed diet and exercise with her especially regarding cutting back on carbohydrates. Strongly encouraged her to make permanent lifestyle changes in regard to her diet and exercise. Her medications will be renewed.

## 2011-11-11 NOTE — Patient Instructions (Signed)
Keep working on [employer] and exercise. Also remember cut back on "white food"!!!

## 2011-11-12 NOTE — Progress Notes (Signed)
Quick Note:  The blood work is normal ______ 

## 2011-11-23 ENCOUNTER — Telehealth: Payer: Self-pay | Admitting: Family Medicine

## 2011-11-23 DIAGNOSIS — I10 Essential (primary) hypertension: Secondary | ICD-10-CM

## 2011-11-23 MED ORDER — SPIRONOLACTONE 25 MG PO TABS: 25.0000 mg | ORAL_TABLET | Freq: Two times a day (BID) | ORAL | Status: DC

## 2011-11-23 NOTE — Telephone Encounter (Signed)
Resent med for 2 times a day

## 2011-11-23 NOTE — Telephone Encounter (Signed)
PT CALLED AND STATED THAT ALDACTONE WAS SENT IN FOR ONE A DAY BUT THIS PT TAKES IT TWICE A DAY. PLEASE SEND CORRECT RX TO [facility] AT [address]

## 2012-10-11 ENCOUNTER — Telehealth: Payer: Self-pay | Admitting: Family Medicine

## 2012-10-11 NOTE — Telephone Encounter (Signed)
Received refill request from [facility] for amlodipine besylate 5 mg 90 day supply

## 2012-10-12 ENCOUNTER — Other Ambulatory Visit: Payer: Self-pay

## 2012-10-12 DIAGNOSIS — I10 Essential (primary) hypertension: Secondary | ICD-10-CM

## 2012-10-12 MED ORDER — AMLODIPINE BESYLATE 5 MG PO TABS: 5.0000 mg | ORAL_TABLET | Freq: Every day | ORAL | Status: DC

## 2012-10-12 NOTE — Telephone Encounter (Signed)
Cover her but she needs an appointment

## 2012-10-12 NOTE — Telephone Encounter (Signed)
SENT IN MED PER [facility]

## 2012-10-19 LAB — HM MAMMOGRAPHY

## 2012-10-23 ENCOUNTER — Other Ambulatory Visit: Payer: Self-pay

## 2012-10-23 DIAGNOSIS — I10 Essential (primary) hypertension: Secondary | ICD-10-CM

## 2012-10-23 MED ORDER — SPIRONOLACTONE 25 MG PO TABS: 25.0000 mg | ORAL_TABLET | Freq: Two times a day (BID) | ORAL | Status: DC

## 2012-10-23 NOTE — Telephone Encounter (Signed)
SENT IN ALDACTONE BUT PT MUST HAVE MED CHECK BEFORE ANYMORE REFILLS

## 2012-10-30 ENCOUNTER — Telehealth: Payer: Self-pay | Admitting: Family Medicine

## 2012-10-30 ENCOUNTER — Other Ambulatory Visit: Payer: Self-pay

## 2012-10-30 DIAGNOSIS — I10 Essential (primary) hypertension: Secondary | ICD-10-CM

## 2012-10-30 MED ORDER — SPIRONOLACTONE 25 MG PO TABS: 25.0000 mg | ORAL_TABLET | Freq: Two times a day (BID) | ORAL | Status: DC

## 2012-10-30 NOTE — Telephone Encounter (Signed)
SENT IN ALDACTON

## 2012-10-30 NOTE — Telephone Encounter (Signed)
Patient called concerning her Rx for spironolactone 25mg , she states it needs to be sent to [facility] instead of [facility]   Scheduled her CPE/med check for 12/11/12

## 2012-10-30 NOTE — Telephone Encounter (Signed)
Had already sent it bur resent to [facility]

## 2012-12-11 ENCOUNTER — Other Ambulatory Visit (HOSPITAL_COMMUNITY)
Admission: RE | Admit: 2012-12-11 | Discharge: 2012-12-11 | Disposition: A | Discharge status: Home / Self Care | Payer: BC Managed Care – PPO | Source: Ambulatory Visit | Attending: Family Medicine | Admitting: Family Medicine

## 2012-12-11 ENCOUNTER — Ambulatory Visit (INDEPENDENT_AMBULATORY_CARE_PROVIDER_SITE_OTHER): Payer: BC Managed Care – PPO | Admitting: Family Medicine

## 2012-12-11 ENCOUNTER — Encounter: Payer: Self-pay | Admitting: Family Medicine

## 2012-12-11 VITALS — BP 118/80 | HR 52 | Ht 64.5 in | Wt 234.0 lb

## 2012-12-11 DIAGNOSIS — Z0000 Encounter for general adult medical examination without abnormal findings: Secondary | ICD-10-CM

## 2012-12-11 DIAGNOSIS — E669 Obesity, unspecified: Secondary | ICD-10-CM

## 2012-12-11 DIAGNOSIS — I872 Venous insufficiency (chronic) (peripheral): Secondary | ICD-10-CM

## 2012-12-11 DIAGNOSIS — I10 Essential (primary) hypertension: Secondary | ICD-10-CM

## 2012-12-11 DIAGNOSIS — Z01419 Encounter for gynecological examination (general) (routine) without abnormal findings: Secondary | ICD-10-CM | POA: Insufficient documentation

## 2012-12-11 LAB — CBC WITH DIFFERENTIAL/PLATELET
Basophils Absolute: 0.1 10*3/uL (ref 0.0–0.1)
Basophils Relative: 1 % (ref 0–1)
Eosinophils Absolute: 0.3 10*3/uL (ref 0.0–0.7)
Lymphs Abs: 2.4 10*3/uL (ref 0.7–4.0)
MCH: 28.8 pg (ref 26.0–34.0)
MCHC: 32.9 g/dL (ref 30.0–36.0)
Neutrophils Relative %: 54 % (ref 43–77)
Platelets: 290 10*3/uL (ref 150–400)
RBC: 4.72 MIL/uL (ref 3.87–5.11)
RDW: 15.3 % (ref 11.5–15.5)

## 2012-12-11 LAB — POCT URINALYSIS DIPSTICK
Glucose, UA: NEGATIVE
Nitrite, UA: NEGATIVE
Urobilinogen, UA: NEGATIVE

## 2012-12-11 LAB — HEMOCCULT GUIAC POC 1CARD (OFFICE)

## 2012-12-11 MED ORDER — LISINOPRIL-HYDROCHLOROTHIAZIDE 10-12.5 MG PO TABS: 1.0000 | ORAL_TABLET | Freq: Every day | ORAL | Status: DC

## 2012-12-11 NOTE — Patient Instructions (Signed)
Continue to work on [employer] and exercise. Use support hose as needed to help with her leg swelling.

## 2012-12-11 NOTE — Progress Notes (Signed)
Subjective:    Patient ID: Jocelyn Bond, female    DOB: Aug 13, 1955, 57 y.o.   MRN: [id number]  HPI She is here for complete examination. She continues on medications listed in the chart. Her main complaint today is her weight and the right leg swelling. She has had difficulty with edema in the right leg for at least 3 years. She does state she has had a previous evaluation which apparently did show venous incompetence. She notes that the swelling does get worse as the day progresses she sits. Her past medical history is unremarkable other than that. She admits to not exercising regularly. She does not smoke and rarely drinks. Work is going well. She is not presently dating anyone.She has had a recent mammogram. She does have one sister with breast cancer   Review of Systems  Constitutional: Negative.   HENT: Negative.   Eyes: Negative.   Respiratory: Negative.   Cardiovascular: Negative.   Gastrointestinal: Negative.   Endocrine: Negative.   Genitourinary: Negative.   Allergic/Immunologic: Negative.   Neurological: Negative.   Hematological: Negative.   Psychiatric/Behavioral: Negative.        Objective:   Physical Exam BP 118/80  Pulse 52  Ht 5' 4.5" (1.638 m)  Wt 234 lb (106.142 kg)  BMI 39.56 kg/m2  General Appearance:    Alert, cooperative, no distress, appears stated age  Head:    Normocephalic, without obvious abnormality, atraumatic  Eyes:    PERRL, conjunctiva/corneas clear, EOM's intact, fundi    benign  Ears:    Normal TM's and external ear canals  Nose:   Nares normal, mucosa normal, no drainage or sinus   tenderness  Throat:   Lips, mucosa, and tongue normal; teeth and gums normal  Neck:   Supple, no lymphadenopathy;  thyroid:  no   enlargement/tenderness/nodules; no carotid   bruit or JVD  Back:    Spine nontender, no curvature, ROM normal, no CVA     tenderness  Lungs:     Clear to auscultation bilaterally without wheezes, rales or     ronchi;  respirations unlabored  Chest Wall:    No tenderness or deformity   Heart:    Regular rate and rhythm, S1 and S2 normal, no murmur, rub   or gallop  Breast Exam:  deferred  Abdomen:     Soft, non-tender, nondistended, normoactive bowel sounds,    no masses, no hepatosplenomegaly  Genitalia:    Normal external genitalia without lesions.  BUS and vagina normal; cervix without lesions, or cervical motion tenderness. No abnormal vaginal discharge.  Uterus and adnexa not enlarged, nontender, no masses.  Pap performed  Rectal:    Normal tone, no masses or tenderness; guaiac negative stool  Extremities:   No clubbing, cyanosis or edema  Pulses:   2+ and symmetric all extremities  Skin:   Skin color, texture, turgor normal, no rashes or lesions  Lymph nodes:   Cervical, supraclavicular, and axillary nodes normal  Neurologic:   CNII-XII intact, normal strength, sensation and gait; reflexes 2+ and symmetric throughout          Psych:   Normal mood, affect, hygiene and grooming.         Assessment & Plan:  Routine general medical examination at a health care facility - Plan: Urinalysis Dipstick, CBC with Differential, Comprehensive metabolic panel, Lipid panel  HYPERTENSION - Plan: lisinopril-hydrochlorothiazide (PRINZIDE,ZESTORETIC) 10-12.5 MG per tablet  Obesity (BMI 30-39.9)  Chronic venous insufficiency I discussed treatment of the venous  insufficiency in regard to support hose, diet, exercise, weight reduction. Also discussed her overall health in regard to these issues. Discussed risk factors with switching to a different BP medication. She will recheck here in one month. Discussed cough and angioedema with her.

## 2012-12-11 NOTE — Addendum Note (Signed)
Addended by: Barbette Or A on: 12/11/2012 09:24 AM   Modules accepted: Orders

## 2012-12-12 LAB — LIPID PANEL
LDL Cholesterol: 115 mg/dL — ABNORMAL HIGH (ref 0–99)
Total CHOL/HDL Ratio: 3.4 Ratio
VLDL: 12 mg/dL (ref 0–40)

## 2012-12-12 LAB — COMPREHENSIVE METABOLIC PANEL
ALT: 16 U/L (ref 0–35)
AST: 21 U/L (ref 0–37)
Alkaline Phosphatase: 85 U/L (ref 39–117)
Sodium: 140 mEq/L (ref 135–145)
Total Bilirubin: 0.5 mg/dL (ref 0.3–1.2)
Total Protein: 7.3 g/dL (ref 6.0–8.3)

## 2012-12-12 NOTE — Progress Notes (Signed)
Quick Note:  CALLED PT HOME/CELL # LEFT MESSAGE LABS LOOK GOOD ______

## 2013-01-09 ENCOUNTER — Ambulatory Visit: Payer: BC Managed Care – PPO | Admitting: Family Medicine

## 2013-02-01 ENCOUNTER — Ambulatory Visit (INDEPENDENT_AMBULATORY_CARE_PROVIDER_SITE_OTHER): Payer: BC Managed Care – PPO | Admitting: Family Medicine

## 2013-02-01 ENCOUNTER — Encounter: Payer: Self-pay | Admitting: Family Medicine

## 2013-02-01 VITALS — BP 124/80 | HR 60

## 2013-02-01 DIAGNOSIS — M7750 Other enthesopathy of unspecified foot: Secondary | ICD-10-CM

## 2013-02-01 DIAGNOSIS — M7752 Other enthesopathy of left foot: Secondary | ICD-10-CM

## 2013-02-01 DIAGNOSIS — I10 Essential (primary) hypertension: Secondary | ICD-10-CM

## 2013-02-01 MED ORDER — LISINOPRIL-HYDROCHLOROTHIAZIDE 10-12.5 MG PO TABS: 1.0000 | ORAL_TABLET | Freq: Every day | ORAL | Status: DC

## 2013-02-01 NOTE — Progress Notes (Signed)
  Subjective:    Patient ID: Jocelyn Bond, female    DOB: Oct 26, 1955, 57 y.o.   MRN: [id number]  HPI He is here for followup on her blood pressure. She ran out of lisinopril and started taking her other medication again. She states that she didn't think that I would refill her medication. She noted worsening of her edema. She has a three-month history of left heel pain that is slowly getting worse. She points to the retro calcaneal area. No history of overuse. She usually wears flat heeled shoes.   Review of Systems     Objective:   Physical Exam Alert and in no distress. Blood pressure is recorded. Exam of her heel shows slight tenderness to palpation in the retrocalcaneal area. Achilles tendon was nontender with no palpable lesions. He'll was normal.       Assessment & Plan:  Hypertension  Retrocalcaneal bursitis (back of heel), left  HYPERTENSION - Plan: lisinopril-hydrochlorothiazide (PRINZIDE,ZESTORETIC) 10-12.5 MG per tablet  I will renew her medicine and have her return one month to recheck. Recommend conservative care for the bursitis with wearing shoes with a slight heel as well as heat to the area and 2 Aleve twice per day.

## 2013-02-01 NOTE — Patient Instructions (Signed)
Use shoes with a small heel. Heat for 20 minutes 3 times per day. 2 Aleve twice a day for the next 10 days

## 2013-04-06 ENCOUNTER — Ambulatory Visit: Payer: BC Managed Care – PPO | Admitting: Family Medicine

## 2013-04-06 ENCOUNTER — Other Ambulatory Visit: Payer: Self-pay | Admitting: Family Medicine

## 2013-06-26 ENCOUNTER — Encounter: Payer: Self-pay | Admitting: Family Medicine

## 2013-06-26 ENCOUNTER — Ambulatory Visit (INDEPENDENT_AMBULATORY_CARE_PROVIDER_SITE_OTHER): Payer: BC Managed Care – PPO | Admitting: Family Medicine

## 2013-06-26 VITALS — BP 129/74 | HR 88 | Wt 238.0 lb

## 2013-06-26 DIAGNOSIS — M722 Plantar fascial fibromatosis: Secondary | ICD-10-CM

## 2013-06-26 NOTE — Progress Notes (Signed)
   Subjective:    Patient ID: Jocelyn Bond, female    DOB: Sep 06, 1955, 57 y.o.   MRN: [id number]  HPI She complains of right heel pain that she's had for the last several weeks. This bothers her when she gets up in the morning and takes her first several steps. Also does have trouble after she sits for a period of time.   Review of Systems     Objective:   Physical Exam Full motion of the ankle. No tenderness over the Achilles tendon or in the retrocalcaneal area. Tender to palpation over the calcaneal spur.       Assessment & Plan:  Plantar fasciitis of right foot  instructed her on proper care of this with stretching, anti-inflammatory medicines, use of heel cups and arch supports. Return here if these are not successful.

## 2013-06-26 NOTE — Patient Instructions (Signed)
Plantar Fasciitis Plantar fasciitis is a common condition that causes foot pain. It is soreness (inflammation) of the band of tough fibrous tissue on the bottom of the foot that runs from the heel bone (calcaneus) to the ball of the foot. The cause of this soreness may be from excessive standing, poor fitting shoes, running on hard surfaces, being overweight, having an abnormal walk, or overuse (this is common in runners) of the painful foot or feet. It is also common in aerobic exercise dancers and ballet dancers. SYMPTOMS  Most people with plantar fasciitis complain of:  Severe pain in the morning on the bottom of their foot especially when taking the first steps out of bed. This pain recedes after a few minutes of walking.  Severe pain is experienced also during walking following a long period of inactivity.  Pain is worse when walking barefoot or up stairs DIAGNOSIS   Your caregiver will diagnose this condition by examining and feeling your foot.  Special tests such as X-rays of your foot, are usually not needed. PREVENTION   Consult a sports medicine professional before beginning a new exercise program.  Walking programs offer a good workout. With walking there is a lower chance of overuse injuries common to runners. There is less impact and less jarring of the joints.  Begin all new exercise programs slowly. If problems or pain develop, decrease the amount of time or distance until you are at a comfortable level.  Wear good shoes and replace them regularly.  Stretch your foot and the heel cords at the back of the ankle (Achilles tendon) both before and after exercise.  Run or exercise on even surfaces that are not hard. For example, asphalt is better than pavement.  Do not run barefoot on hard surfaces.  If using a treadmill, vary the incline.  Do not continue to workout if you have foot or joint problems. Seek professional help if they do not improve. HOME CARE INSTRUCTIONS     Avoid activities that cause you pain until you recover.  Use ice or cold packs on the problem or painful areas after working out.  Only take over-the-counter or prescription medicines for pain, discomfort, or fever as directed by your caregiver.  Soft shoe inserts or athletic shoes with air or gel sole cushions may be helpful.  If problems continue or become more severe, consult a sports medicine caregiver or your own health care provider. Cortisone is a potent anti-inflammatory medication that may be injected into the painful area. You can discuss this treatment with your caregiver. MAKE SURE YOU:   Understand these instructions.  Will watch your condition.  Will get help right away if you are not doing well or get worse. Document Released: 03/16/2001 Document Revised: 09/13/2011 Document Reviewed: 05/15/2008 [facility] Patient Information 2014 [place], [place]. First use the heel cups for several weeks and see if that'll relieve it and if it doesn't then go to arch supports. You can use Advil or Aleve. Traction daily and stretch before you get out of bed

## 2014-02-13 ENCOUNTER — Other Ambulatory Visit: Payer: Self-pay | Admitting: Family Medicine

## 2014-05-16 ENCOUNTER — Emergency Department (HOSPITAL_COMMUNITY)
Admission: EM | Admit: 2014-05-16 | Discharge: 2014-05-16 | Disposition: A | Discharge status: Home / Self Care | Payer: BC Managed Care – PPO | Attending: Emergency Medicine | Admitting: Emergency Medicine

## 2014-05-16 ENCOUNTER — Emergency Department (HOSPITAL_COMMUNITY): Payer: BC Managed Care – PPO

## 2014-05-16 ENCOUNTER — Encounter (HOSPITAL_COMMUNITY): Payer: Self-pay

## 2014-05-16 DIAGNOSIS — Y9389 Activity, other specified: Secondary | ICD-10-CM | POA: Insufficient documentation

## 2014-05-16 DIAGNOSIS — I10 Essential (primary) hypertension: Secondary | ICD-10-CM | POA: Diagnosis not present

## 2014-05-16 DIAGNOSIS — S8991XA Unspecified injury of right lower leg, initial encounter: Secondary | ICD-10-CM | POA: Diagnosis not present

## 2014-05-16 DIAGNOSIS — S4991XA Unspecified injury of right shoulder and upper arm, initial encounter: Secondary | ICD-10-CM | POA: Insufficient documentation

## 2014-05-16 DIAGNOSIS — E669 Obesity, unspecified: Secondary | ICD-10-CM | POA: Insufficient documentation

## 2014-05-16 DIAGNOSIS — Y990 Civilian activity done for income or pay: Secondary | ICD-10-CM | POA: Diagnosis not present

## 2014-05-16 DIAGNOSIS — S299XXA Unspecified injury of thorax, initial encounter: Secondary | ICD-10-CM | POA: Diagnosis not present

## 2014-05-16 DIAGNOSIS — Y92410 Unspecified street and highway as the place of occurrence of the external cause: Secondary | ICD-10-CM | POA: Diagnosis not present

## 2014-05-16 MED ORDER — IBUPROFEN 800 MG PO TABS
800.0000 mg | ORAL_TABLET | Freq: Once | ORAL | Status: AC
  Administered 2014-05-16: 800 mg via ORAL
  Filled 2014-05-16: qty 1

## 2014-05-16 MED ORDER — IBUPROFEN 800 MG PO TABS: 800.0000 mg | ORAL_TABLET | Freq: Three times a day (TID) | ORAL | Status: DC | PRN
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
## 2014-05-16 NOTE — ED Notes (Signed)
Patient is alert and oriented x3.  She was given DC instructions and follow up visit instructions.  Patient gave verbal understanding. She was DC ambulatory under her own power to home.  V/S stable.  He was not showing any signs of distress on DC 
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
## 2014-05-16 NOTE — ED Notes (Signed)
EKG given to EDP,Oni,MD., for review. 
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
## 2014-05-17 ENCOUNTER — Ambulatory Visit (INDEPENDENT_AMBULATORY_CARE_PROVIDER_SITE_OTHER): Payer: BC Managed Care – PPO | Admitting: Family Medicine

## 2014-05-17 ENCOUNTER — Encounter: Payer: Self-pay | Admitting: Family Medicine

## 2014-05-17 VITALS — BP 122/80 | HR 80 | Wt 240.0 lb

## 2014-05-17 DIAGNOSIS — S20211A Contusion of right front wall of thorax, initial encounter: Secondary | ICD-10-CM
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
## 2014-08-14 ENCOUNTER — Other Ambulatory Visit: Payer: Self-pay

## 2014-08-14 ENCOUNTER — Telehealth: Payer: Self-pay | Admitting: Family Medicine

## 2014-08-14 MED ORDER — LISINOPRIL-HYDROCHLOROTHIAZIDE 10-12.5 MG PO TABS: 1.0000 | ORAL_TABLET | Freq: Every day | ORAL | Status: DC

## 2014-08-14 NOTE — Telephone Encounter (Signed)
Done

## 2014-08-14 NOTE — Telephone Encounter (Signed)
Pt made CPE appt for 09/10/14. Requesting refill on Lisinopril 10-12.5 MG

## 2014-09-10 ENCOUNTER — Encounter: Payer: Self-pay | Admitting: Family Medicine

## 2014-09-10 ENCOUNTER — Ambulatory Visit (INDEPENDENT_AMBULATORY_CARE_PROVIDER_SITE_OTHER): Payer: BC Managed Care – PPO | Admitting: Family Medicine

## 2014-09-10 VITALS — BP 126/80 | HR 80 | Ht 64.5 in | Wt 235.0 lb

## 2014-09-10 DIAGNOSIS — M1990 Unspecified osteoarthritis, unspecified site: Secondary | ICD-10-CM

## 2014-09-10 DIAGNOSIS — I10 Essential (primary) hypertension: Secondary | ICD-10-CM | POA: Diagnosis not present

## 2014-09-10 DIAGNOSIS — I872 Venous insufficiency (chronic) (peripheral): Secondary | ICD-10-CM | POA: Diagnosis not present

## 2014-09-10 DIAGNOSIS — E669 Obesity, unspecified: Secondary | ICD-10-CM | POA: Diagnosis not present

## 2014-09-10 DIAGNOSIS — Z0000 Encounter for general adult medical examination without abnormal findings: Secondary | ICD-10-CM | POA: Diagnosis not present

## 2014-09-10 DIAGNOSIS — H02739 Vitiligo of unspecified eye, unspecified eyelid and periocular area: Secondary | ICD-10-CM

## 2014-09-10 LAB — POCT URINALYSIS DIPSTICK
BILIRUBIN UA: NEGATIVE
Blood, UA: NEGATIVE
Glucose, UA: NEGATIVE
Ketones, UA: NEGATIVE
Nitrite, UA: NEGATIVE
Urobilinogen, UA: NEGATIVE
pH, UA: 6

## 2014-09-10 LAB — COMPREHENSIVE METABOLIC PANEL
ALT: 16 U/L (ref 0–35)
AST: 23 U/L (ref 0–37)
Albumin: 3.5 g/dL (ref 3.5–5.2)
Alkaline Phosphatase: 71 U/L (ref 39–117)
BILIRUBIN TOTAL: 0.4 mg/dL (ref 0.2–1.2)
BUN: 17 mg/dL (ref 6–23)
CALCIUM: 8.6 mg/dL (ref 8.4–10.5)
CO2: 28 mEq/L (ref 19–32)
Chloride: 104 mEq/L (ref 96–112)
Creat: 0.78 mg/dL (ref 0.50–1.10)
Glucose, Bld: 82 mg/dL (ref 70–99)
POTASSIUM: 4.3 meq/L (ref 3.5–5.3)
Sodium: 141 mEq/L (ref 135–145)
Total Protein: 7.1 g/dL (ref 6.0–8.3)

## 2014-09-10 LAB — LIPID PANEL
Cholesterol: 172 mg/dL (ref 0–200)
HDL: 55 mg/dL (ref >=46)
LDL Cholesterol: 104 mg/dL — ABNORMAL HIGH (ref 0–99)
Total CHOL/HDL Ratio: 3.1 Ratio
Triglycerides: 67 mg/dL (ref <150)
VLDL: 13 mg/dL (ref 0–40)

## 2014-09-10 LAB — HM MAMMOGRAPHY
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
## 2014-09-10 NOTE — Patient Instructions (Signed)
Cut back on "white food". Bread, rice, pasta, potatoes and sugar

## 2014-09-11 ENCOUNTER — Encounter: Payer: Self-pay | Admitting: Internal Medicine

## 2014-09-11 LAB — CBC WITH DIFFERENTIAL/PLATELET
Basophils Absolute: 0.1 10*3/uL (ref 0.0–0.1)
Basophils Relative: 1 % (ref 0–1)
EOS ABS: 0.6 10*3/uL (ref 0.0–0.7)
Eosinophils Relative: 6 % — ABNORMAL HIGH (ref 0–5)
HEMATOCRIT: 41.7 % (ref 36.0–46.0)
Hemoglobin: 13.7 g/dL (ref 12.0–15.0)
Lymphocytes Relative: 28 % (ref 12–46)
Lymphs Abs: 2.6 10*3/uL (ref 0.7–4.0)
MCH: 29 pg (ref 26.0–34.0)
MCHC: 32.9 g/dL (ref 30.0–36.0)
MCV: 88.3 fL (ref 78.0–100.0)
MONOS PCT: 13 % — AB (ref 3–12)
MPV: 11 fL (ref 8.6–12.4)
Monocytes Absolute: 1.2 10*3/uL — ABNORMAL HIGH (ref 0.1–1.0)
NEUTROS PCT: 52 % (ref 43–77)
Neutro Abs: 4.8 10*3/uL (ref 1.7–7.7)
Platelets: 274 10*3/uL (ref 150–400)
RBC: 4.72 MIL/uL (ref 3.87–5.11)
RDW: 14.6 % (ref 11.5–15.5)
WBC: 9.3 10*3/uL (ref 4.0–10.5)

## 2014-11-18 ENCOUNTER — Telehealth: Payer: Self-pay | Admitting: Family Medicine

## 2014-11-18 MED ORDER — LISINOPRIL-HYDROCHLOROTHIAZIDE 10-12.5 MG PO TABS: 1.0000 | ORAL_TABLET | Freq: Every day | ORAL | Status: DC

## 2014-11-18 NOTE — Telephone Encounter (Signed)
done

## 2014-11-18 NOTE — Telephone Encounter (Signed)
Pt needs [facility]ills [facility]. Pt needs refills sent to lisinopril [facility]  [facility] [address]

## 2015-08-31 ENCOUNTER — Other Ambulatory Visit: Payer: Self-pay | Admitting: Family Medicine

## 2015-09-03 ENCOUNTER — Other Ambulatory Visit: Payer: Self-pay | Admitting: Family Medicine

## 2015-10-15 LAB — HM MAMMOGRAPHY: HM Mammogram: NORMAL (ref 0–4)

## 2015-10-21 ENCOUNTER — Encounter: Payer: Self-pay | Admitting: Family Medicine

## 2015-11-26 ENCOUNTER — Telehealth: Payer: Self-pay | Admitting: Family Medicine

## 2015-11-26 ENCOUNTER — Other Ambulatory Visit: Payer: Self-pay

## 2015-11-26 MED ORDER — LISINOPRIL-HYDROCHLOROTHIAZIDE 10-12.5 MG PO TABS: 1.0000 | ORAL_TABLET | Freq: Every day | ORAL | Status: DC

## 2015-11-26 NOTE — Telephone Encounter (Signed)
Pt has CPE 6/16, needs refill lisinopril/HCTZ for 90 days to [place].

## 2015-11-26 NOTE — Telephone Encounter (Signed)
Sent b/p med in

## 2015-12-19 ENCOUNTER — Other Ambulatory Visit (HOSPITAL_COMMUNITY)
Admission: RE | Admit: 2015-12-19 | Discharge: 2015-12-19 | Disposition: A | Discharge status: Home / Self Care | Payer: BC Managed Care – PPO | Source: Ambulatory Visit | Attending: Family Medicine | Admitting: Family Medicine

## 2015-12-19 ENCOUNTER — Ambulatory Visit (INDEPENDENT_AMBULATORY_CARE_PROVIDER_SITE_OTHER): Payer: BC Managed Care – PPO | Admitting: Family Medicine

## 2015-12-19 ENCOUNTER — Encounter: Payer: Self-pay | Admitting: Family Medicine

## 2015-12-19 VITALS — BP 120/60 | HR 77 | Ht 65.0 in | Wt 224.0 lb

## 2015-12-19 DIAGNOSIS — I872 Venous insufficiency (chronic) (peripheral): Secondary | ICD-10-CM | POA: Diagnosis not present

## 2015-12-19 DIAGNOSIS — E669 Obesity, unspecified: Secondary | ICD-10-CM | POA: Diagnosis not present

## 2015-12-19 DIAGNOSIS — I10 Essential (primary) hypertension: Secondary | ICD-10-CM

## 2015-12-19 DIAGNOSIS — Z0000 Encounter for general adult medical examination without abnormal findings: Secondary | ICD-10-CM | POA: Diagnosis not present

## 2015-12-19 DIAGNOSIS — Z01419 Encounter for gynecological examination (general) (routine) without abnormal findings: Secondary | ICD-10-CM | POA: Diagnosis not present

## 2015-12-19 DIAGNOSIS — Z1159 Encounter for screening for other viral diseases: Secondary | ICD-10-CM

## 2015-12-19 LAB — COMPREHENSIVE METABOLIC PANEL
ALBUMIN: 3.4 g/dL — AB (ref 3.6–5.1)
ALK PHOS: 76 U/L (ref 33–130)
ALT: 16 U/L (ref 6–29)
AST: 22 U/L (ref 10–35)
BILIRUBIN TOTAL: 0.4 mg/dL (ref 0.2–1.2)
BUN: 12 mg/dL (ref 7–25)
CALCIUM: 8.9 mg/dL (ref 8.6–10.4)
CO2: 29 mmol/L (ref 20–31)
Chloride: 105 mmol/L (ref 98–110)
Creat: 0.97 mg/dL (ref 0.50–1.05)
Glucose, Bld: 90 mg/dL (ref 65–99)
POTASSIUM: 3.9 mmol/L (ref 3.5–5.3)
Sodium: 140 mmol/L (ref 135–146)
Total Protein: 7 g/dL (ref 6.1–8.1)

## 2015-12-19 LAB — CBC WITH DIFFERENTIAL/PLATELET
BASOS PCT: 1 %
Basophils Absolute: 77 cells/uL (ref 0–200)
EOS PCT: 4 %
Eosinophils Absolute: 308 cells/uL (ref 15–500)
HCT: 39.2 % (ref 35.0–45.0)
Hemoglobin: 12.7 g/dL (ref 11.7–15.5)
LYMPHS PCT: 28 %
Lymphs Abs: 2156 cells/uL (ref 850–3900)
MCH: 28.6 pg (ref 27.0–33.0)
MCHC: 32.4 g/dL (ref 32.0–36.0)
MCV: 88.3 fL (ref 80.0–100.0)
MONOS PCT: 14 %
MPV: 10 fL (ref 7.5–12.5)
Monocytes Absolute: 1078 cells/uL — ABNORMAL HIGH (ref 200–950)
NEUTROS ABS: 4081 {cells}/uL (ref 1500–7800)
Neutrophils Relative %: 53 %
Platelets: 290 10*3/uL (ref 140–400)
RBC: 4.44 MIL/uL (ref 3.80–5.10)
RDW: 15 % (ref 11.0–15.0)
WBC: 7.7 10*3/uL (ref 4.0–10.5)

## 2015-12-19 LAB — POCT URINALYSIS DIPSTICK
BILIRUBIN UA: NEGATIVE
Blood, UA: NEGATIVE
GLUCOSE UA: NEGATIVE
Ketones, UA: NEGATIVE
LEUKOCYTES UA: NEGATIVE
Nitrite, UA: NEGATIVE
Protein, UA: 6
Spec Grav, UA: >=1.03
Urobilinogen, UA: NEGATIVE

## 2015-12-19 LAB — LIPID PANEL
Cholesterol: 201 mg/dL — ABNORMAL HIGH (ref 125–200)
HDL: 65 mg/dL (ref >=46)
LDL Cholesterol: 124 mg/dL (ref <130)
TRIGLYCERIDES: 60 mg/dL (ref <150)
Total CHOL/HDL Ratio: 3.1 Ratio (ref <=5.0)
VLDL: 12 mg/dL (ref <30)

## 2015-12-19 MED ORDER — LISINOPRIL-HYDROCHLOROTHIAZIDE 10-12.5 MG PO TABS: 1.0000 | ORAL_TABLET | Freq: Every day | ORAL | Status: DC

## 2015-12-19 NOTE — Progress Notes (Signed)
   Subjective:    Patient ID: Jocelyn Bond, female    DOB: [birth year], 60 y.o.   MRN: [id number]  HPI  She is here for a complete examination. She continues on lisinopril and is having no difficulty with this. She does have some chronic venous insufficiency in her extremities and does have some swelling mainly in the ankles. It does tend to get better when she gets up in the morning. She now has 2 jobs and has lost 12 pounds since her last visit. She has no other concerns or complaints. Her work and home life are going well. Family and social history as well as health maintenance and immunizations were reviewed. She's had no difficulty with chest pain, shortness of breath or GI issues.   Review of Systems  All other systems reviewed and are negative.      Objective:   Physical Exam BP 120/60 mmHg  Pulse 77  Ht 5\' 5"  (1.651 m)  Wt 224 lb (101.606 kg)  BMI 37.28 kg/m2  General Appearance:    Alert, cooperative, no distress, appears stated age  Head:    Normocephalic, without obvious abnormality, atraumatic  Eyes:    PERRL, conjunctiva/corneas clear, EOM's intact, fundi    benign  Ears:    Normal TM's and external ear canals  Nose:   Nares normal, mucosa normal, no drainage or sinus   tenderness  Throat:   Lips, mucosa, and tongue normal; teeth and gums normal  Neck:   Supple, no lymphadenopathy;  thyroid:  no   enlargement/tenderness/nodules; no carotid   bruit or JVD  Back:    Spine nontender, no curvature, ROM normal, no CVA     tenderness  Lungs:     Clear to auscultation bilaterally without wheezes, rales or     ronchi; respirations unlabored  Chest Wall:    No tenderness or deformity   Heart:    Regular rate and rhythm, S1 and S2 normal, no murmur, rub   or gallop     Abdomen:     Soft, non-tender, nondistended, normoactive bowel sounds,    no masses, no hepatosplenomegaly  Genitalia:    Normal external genitalia without lesions.  BUS and vagina normal; cervix without  lesions, or cervical motion tenderness. No abnormal vaginal discharge.  Uterus and adnexa not enlarged, nontender, no masses.  Pap performed     Extremities:   No clubbing, cyanosis or edema  Pulses:   2+ and symmetric all extremities  Skin:   Skin color, texture, turgor normal, no rashes or lesions  Lymph nodes:   Cervical, supraclavicular, and axillary nodes normal  Neurologic:   CNII-XII intact, normal strength, sensation and gait; reflexes 2+ and symmetric throughout          Psych:   Normal mood, affect, hygiene and grooming.         Assessment & Plan:  Routine general medical examination at a health care facility - Plan: POCT Urinalysis Dipstick, Cytology - PAP [place], Comprehensive metabolic panel, CBC with Differential/Platelet, Lipid panel  Essential hypertension - Plan: CBC with Differential/Platelet, lisinopril-hydrochlorothiazide (PRINZIDE,ZESTORETIC) 10-12.5 MG tablet  Chronic venous insufficiency - Plan: Comprehensive metabolic panel  Obesity (BMI 30-39.9)  Need for hepatitis C screening test - Plan: Hepatitis C antibody I encouraged her to continue with her diet and exercise program and hopefully next year she will lose another 12 pounds. Her medications were renewed.

## 2015-12-20 LAB — HEPATITIS C ANTIBODY: HCV Ab: NEGATIVE

## 2015-12-22 LAB — CYTOLOGY - PAP

## 2016-02-16 ENCOUNTER — Encounter: Payer: Self-pay | Admitting: Family Medicine

## 2016-02-16 ENCOUNTER — Ambulatory Visit (INDEPENDENT_AMBULATORY_CARE_PROVIDER_SITE_OTHER): Payer: BC Managed Care – PPO | Admitting: Family Medicine

## 2016-02-16 DIAGNOSIS — R591 Generalized enlarged lymph nodes: Secondary | ICD-10-CM

## 2016-02-16 DIAGNOSIS — R599 Enlarged lymph nodes, unspecified: Secondary | ICD-10-CM

## 2016-02-16 NOTE — Progress Notes (Signed)
   Subjective:    Patient ID: Jocelyn Bond, female    DOB: [birth year], 60 y.o.   MRN: [id number]  HPI Chief Complaint  Patient presents with  . Cyst    knot on neck- friday night- sore to touch   She is here with complaints of a "knot" to the right posterior side of her neck that is tender to the touch. She noticed this 3 days ago while she was just feeling her neck. Denies pain to the area.  Denies insect bite.   Denies fever, chills, fatigue, weight changes, headache, congestion, drainage, sinus pressure, ear ache, sore throat, cough, GI or GU symptoms.  Denies recent illness.    Review of Systems Pertinent positives and negatives in the history of present illness.     Objective:   Physical Exam  Constitutional: She appears well-developed and well-nourished. No distress.  HENT:  Right Ear: Tympanic membrane and ear canal normal.  Left Ear: Tympanic membrane and ear canal normal.  Nose: Nose normal.  Mouth/Throat: Oropharynx is clear and moist and mucous membranes are normal.  Eyes: Conjunctivae are normal.  Neck: Normal range of motion. Neck supple.  Lymphadenopathy:       Head (right side): Occipital adenopathy present. No submandibular, no tonsillar, no preauricular and no posterior auricular adenopathy present.       Head (left side): No submandibular, no tonsillar, no preauricular, no posterior auricular and no occipital adenopathy present.    She has no cervical adenopathy.    She has no axillary adenopathy.       Right: No supraclavicular adenopathy present.       Left: No supraclavicular adenopathy present.  2 cm smooth, round and movable node to right occipital region. No other lymph node enlargement or tendernss.    BP 138/80   Pulse 64   Temp 98 F (36.7 C) (Oral)   Wt 224 lb 9.6 oz (101.9 kg)   BMI 37.38 kg/m       Assessment & Plan:  Lymphadenopathy of head and neck  Discussed that this appears to be an enlarged occipital lymph node and advised  that this should go away in time. Recommend watchful waiting. Reassured patient. She is not ill appearing and feels fine otherwise. Dr. Redmond School also examined patient and agrees with plan of care.

## 2016-06-17 ENCOUNTER — Encounter: Payer: Self-pay | Admitting: Family Medicine

## 2016-06-17 ENCOUNTER — Ambulatory Visit (INDEPENDENT_AMBULATORY_CARE_PROVIDER_SITE_OTHER): Payer: BC Managed Care – PPO | Admitting: Family Medicine

## 2016-06-17 VITALS — BP 110/68 | HR 74 | Wt 224.0 lb

## 2016-06-17 DIAGNOSIS — I10 Essential (primary) hypertension: Secondary | ICD-10-CM

## 2016-06-17 DIAGNOSIS — T783XXA Angioneurotic edema, initial encounter: Secondary | ICD-10-CM

## 2016-06-17 MED ORDER — LOSARTAN POTASSIUM-HCTZ 50-12.5 MG PO TABS: 1.0000 | ORAL_TABLET | Freq: Every day | ORAL | 3 refills | Status: DC

## 2016-06-17 NOTE — Progress Notes (Signed)
   Subjective:    Patient ID: Jocelyn Bond, female    DOB: 04-18-1956, 60 y.o.   MRN: [id number]  HPI She is here for consult concerning switching medications. She is on lisinopril/HCTZ and has had on several occasions swelling of the lips. It usually occurs after she has had some minor trauma to her lips.   Review of Systems     Objective:   Physical Exam Alert and in no distress. Review of low-dose that she took on her cell phone does indicate swelling consistent with angioedema       Assessment & Plan:  Angioedema of lips, initial encounter  Essential hypertension - Plan: losartan-hydrochlorothiazide (HYZAAR) 50-12.5 MG tablet I will switch her to Hyzaar. Discussed the possibility of recurrence of the angioedema on this medication and other possible side effects. She will call if she has any problems otherwise see her in one month.

## 2016-07-20 ENCOUNTER — Ambulatory Visit: Payer: BC Managed Care – PPO | Admitting: Family Medicine

## 2016-08-27 ENCOUNTER — Encounter (HOSPITAL_COMMUNITY): Payer: Self-pay

## 2016-08-27 ENCOUNTER — Emergency Department (HOSPITAL_COMMUNITY)
Admission: EM | Admit: 2016-08-27 | Discharge: 2016-08-27 | Disposition: A | Discharge status: Home / Self Care | Payer: BC Managed Care – PPO | Attending: Emergency Medicine | Admitting: Emergency Medicine

## 2016-08-27 ENCOUNTER — Emergency Department (HOSPITAL_COMMUNITY): Payer: BC Managed Care – PPO

## 2016-08-27 DIAGNOSIS — R0789 Other chest pain: Secondary | ICD-10-CM

## 2016-08-27 DIAGNOSIS — M25561 Pain in right knee: Secondary | ICD-10-CM | POA: Diagnosis not present

## 2016-08-27 DIAGNOSIS — R079 Chest pain, unspecified: Secondary | ICD-10-CM | POA: Diagnosis not present

## 2016-08-27 DIAGNOSIS — Z7982 Long term (current) use of aspirin: Secondary | ICD-10-CM | POA: Insufficient documentation

## 2016-08-27 DIAGNOSIS — Y92410 Unspecified street and highway as the place of occurrence of the external cause: Secondary | ICD-10-CM | POA: Insufficient documentation

## 2016-08-27 DIAGNOSIS — Y939 Activity, unspecified: Secondary | ICD-10-CM | POA: Diagnosis not present

## 2016-08-27 DIAGNOSIS — Z79899 Other long term (current) drug therapy: Secondary | ICD-10-CM | POA: Insufficient documentation

## 2016-08-27 DIAGNOSIS — I10 Essential (primary) hypertension: Secondary | ICD-10-CM | POA: Insufficient documentation

## 2016-08-27 DIAGNOSIS — Y999 Unspecified external cause status: Secondary | ICD-10-CM | POA: Insufficient documentation

## 2016-08-27 LAB — I-STAT TROPONIN, ED: TROPONIN I, POC: 0.01 ng/mL (ref 0.00–0.08)

## 2016-08-27 MED ORDER — NAPROXEN 375 MG PO TABS: 375.0000 mg | ORAL_TABLET | Freq: Two times a day (BID) | ORAL | 0 refills | Status: DC

## 2016-08-27 MED ORDER — HYDROCODONE-ACETAMINOPHEN 5-325 MG PO TABS: 1.0000 | ORAL_TABLET | Freq: Once | ORAL | Status: DC

## 2016-08-27 MED ORDER — CYCLOBENZAPRINE HCL 10 MG PO TABS: 10.0000 mg | ORAL_TABLET | Freq: Two times a day (BID) | ORAL | 0 refills | Status: DC | PRN

## 2016-08-27 MED ORDER — NAPROXEN 375 MG PO TABS
375.0000 mg | ORAL_TABLET | Freq: Once | ORAL | Status: AC
  Administered 2016-08-27: 375 mg via ORAL
  Filled 2016-08-27: qty 1
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
## 2016-08-27 NOTE — Discharge Instructions (Signed)
Your x-ray was negative for any fractures. Her EKG was normal. This is likely musculoskeletal pain. Please take the naproxen starting tomorrow twice a day. Do not take any extra Motrin, ibuprofen, Advil with this medication. He may take Tylenol. He also may take the Flexeril as needed for muscle relaxation. This medication will make you drowsy so do not drive with it. Soaking in a warm bath with Epson salt will help with muscle aches. Apply heating pad to painful area. Follow-up with your primary care doctor if her symptoms do not improve. Return to the ED if your symptoms worsen.
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
## 2016-08-27 NOTE — ED Notes (Signed)
Pt in xray

## 2016-08-30 ENCOUNTER — Telehealth: Payer: Self-pay

## 2016-08-30 DIAGNOSIS — I10 Essential (primary) hypertension: Secondary | ICD-10-CM

## 2016-08-30 MED ORDER — LOSARTAN POTASSIUM-HCTZ 50-12.5 MG PO TABS: 1.0000 | ORAL_TABLET | Freq: Every day | ORAL | 3 refills | Status: DC

## 2016-08-30 NOTE — Telephone Encounter (Signed)
Faxed request for losartan/hctz to [facility] on [address] for 90 day supply.

## 2016-10-15 ENCOUNTER — Telehealth: Payer: Self-pay | Admitting: Family Medicine

## 2016-10-15 DIAGNOSIS — I10 Essential (primary) hypertension: Secondary | ICD-10-CM

## 2016-10-15 NOTE — Telephone Encounter (Signed)
Pt called and stated she would like to get her bp meds 90 day refills. It is a lot cheaper for her. Pt uses [facility] [address]. She can be reached at [phone].

## 2016-10-16 MED ORDER — LOSARTAN POTASSIUM-HCTZ 50-12.5 MG PO TABS: 1.0000 | ORAL_TABLET | Freq: Every day | ORAL | 2 refills | Status: DC

## 2017-06-22 LAB — HM MAMMOGRAPHY

## 2017-07-07 ENCOUNTER — Encounter: Payer: Self-pay | Admitting: Family Medicine

## 2017-07-26 ENCOUNTER — Other Ambulatory Visit: Payer: Self-pay | Admitting: *Deleted

## 2017-07-26 DIAGNOSIS — I10 Essential (primary) hypertension: Secondary | ICD-10-CM

## 2017-07-26 MED ORDER — LOSARTAN POTASSIUM-HCTZ 50-12.5 MG PO TABS: 1.0000 | ORAL_TABLET | Freq: Every day | ORAL | 2 refills | Status: DC

## 2017-11-08 ENCOUNTER — Telehealth: Payer: Self-pay | Admitting: Family Medicine

## 2017-11-08 NOTE — Telephone Encounter (Signed)
Pt called & states she went to pick up for BP medicine & the cost went from $10 to $25 & nothing changed with medication.  Not the dosage, strength, quantity or anything.  I called pharmacy & they couldn't tell me why,  I called insurance company t# [phone] & they said the only thing that changed was the amount the pharmacy was allowed to charge the patient.  And that was the only explanation that they could give.  I called pt back and left her a message.  She can call her insurance company member services and see if they can give her any other explanations.

## 2018-03-03 ENCOUNTER — Other Ambulatory Visit: Payer: Self-pay | Admitting: Family Medicine

## 2018-03-03 ENCOUNTER — Telehealth: Payer: Self-pay

## 2018-03-03 DIAGNOSIS — I10 Essential (primary) hypertension: Secondary | ICD-10-CM

## 2018-03-03 MED ORDER — HYDROCHLOROTHIAZIDE 12.5 MG PO CAPS: 12.5000 mg | ORAL_CAPSULE | Freq: Every day | ORAL | 0 refills | Status: DC

## 2018-03-03 MED ORDER — LOSARTAN POTASSIUM 50 MG PO TABS: 50.0000 mg | ORAL_TABLET | Freq: Every day | ORAL | 0 refills | Status: DC

## 2018-03-03 NOTE — Telephone Encounter (Signed)
Pharmacy has sent a request asking that we send separate medications for Losartan 50mg  and HCTZ 12.5MG .

## 2018-03-28 ENCOUNTER — Other Ambulatory Visit: Payer: Self-pay | Admitting: Family Medicine

## 2018-03-28 DIAGNOSIS — I10 Essential (primary) hypertension: Secondary | ICD-10-CM

## 2018-03-28 NOTE — Telephone Encounter (Signed)
Called pt due to needing an appt. Pt has not been seen in office in over a year.

## 2018-04-10 ENCOUNTER — Ambulatory Visit: Payer: BC Managed Care – PPO | Admitting: Family Medicine

## 2018-04-10 ENCOUNTER — Encounter: Payer: Self-pay | Admitting: Family Medicine

## 2018-04-10 VITALS — BP 132/88 | HR 66 | Temp 97.7°F | Wt 246.2 lb

## 2018-04-10 DIAGNOSIS — I10 Essential (primary) hypertension: Secondary | ICD-10-CM | POA: Diagnosis not present

## 2018-04-10 DIAGNOSIS — J209 Acute bronchitis, unspecified: Secondary | ICD-10-CM

## 2018-04-10 DIAGNOSIS — Z23 Encounter for immunization: Secondary | ICD-10-CM

## 2018-04-10 MED ORDER — AMOXICILLIN 875 MG PO TABS: 875.0000 mg | ORAL_TABLET | Freq: Two times a day (BID) | ORAL | 0 refills | Status: DC

## 2018-04-10 NOTE — Progress Notes (Signed)
   Subjective:    Patient ID: Jocelyn Bond, female    DOB: 03/24/1956, 62 y.o.   MRN: [id number]  HPI She complains of a several month history of intermittent cough but no fever, chills, sore throat, earache, shortness of breath.  Cough is intermittently productive of whitish sputum.  She stopped taking her antihypertensive medication about a week ago to see if that made a difference and it did not.  Review of Systems     Objective:   Physical Exam Alert and in no distress. Tympanic membranes and canals are normal. Pharyngeal area is normal. Neck is supple without adenopathy or thyromegaly. Cardiac exam shows a regular sinus rhythm without murmurs or gallops. Lungs are clear to auscultation.       Assessment & Plan:  Acute bronchitis, unspecified organism Amoxil  Essential hypertension  Need for influenza vaccination - Plan: Flu Vaccine QUAD 6+ mos PF IM (Fluarix Quad PF)  Need for shingles vaccine - Plan: Varicella-zoster vaccine IM She is to start back on her blood pressure medication.  She is to take all the antibiotic and if not totally back to normal when she finishes, she is to call me.

## 2018-04-10 NOTE — Patient Instructions (Signed)
Take all the antibiotic and if you are not totally better when you finish call me

## 2018-05-01 ENCOUNTER — Telehealth: Payer: Self-pay | Admitting: Family Medicine

## 2018-05-01 MED ORDER — AMOXICILLIN-POT CLAVULANATE 875-125 MG PO TABS: 1.0000 | ORAL_TABLET | Freq: Two times a day (BID) | ORAL | 0 refills | Status: DC

## 2018-05-01 NOTE — Telephone Encounter (Signed)
Let her know that I called a more potent antibiotic and for her and let us know if she is not totally back to normal

## 2018-05-01 NOTE — Telephone Encounter (Signed)
Pt called and states that she is still having a cough with thick white mucous and she feels about 55 percent better, she is wanting to know if you will send another round of antibiotics   in  Pt can be reached at [phone] and pt uses [id number] - [place], [place] [address]

## 2018-05-01 NOTE — Telephone Encounter (Signed)
Pt was called. KH 

## 2018-05-03 ENCOUNTER — Other Ambulatory Visit: Payer: Self-pay | Admitting: Family Medicine

## 2018-05-03 DIAGNOSIS — I10 Essential (primary) hypertension: Secondary | ICD-10-CM

## 2018-05-08 ENCOUNTER — Other Ambulatory Visit: Payer: Self-pay | Admitting: Family Medicine

## 2018-05-08 DIAGNOSIS — I10 Essential (primary) hypertension: Secondary | ICD-10-CM

## 2018-06-16 LAB — HM MAMMOGRAPHY

## 2018-06-26 ENCOUNTER — Telehealth: Payer: Self-pay

## 2018-06-26 NOTE — Telephone Encounter (Signed)
Patient called stating she would like for something else to be sent to the pharmacy for her cough. It cleared up but now she is coughing again.

## 2018-06-26 NOTE — Telephone Encounter (Signed)
Needs an appt

## 2018-06-26 NOTE — Telephone Encounter (Signed)
Called pt to advise if she needed to see [place] sooner than the 7th to call office. Jocelyn Bond

## 2018-07-11 ENCOUNTER — Encounter: Payer: Self-pay | Admitting: Family Medicine

## 2018-07-11 ENCOUNTER — Ambulatory Visit: Payer: BC Managed Care – PPO | Admitting: Family Medicine

## 2018-07-11 VITALS — BP 130/75 | HR 88 | Temp 98.3°F | Resp 16 | Ht 65.5 in | Wt 247.2 lb

## 2018-07-11 DIAGNOSIS — I10 Essential (primary) hypertension: Secondary | ICD-10-CM

## 2018-07-11 DIAGNOSIS — Z23 Encounter for immunization: Secondary | ICD-10-CM | POA: Diagnosis not present

## 2018-07-11 MED ORDER — LOSARTAN POTASSIUM-HCTZ 50-12.5 MG PO TABS: 1.0000 | ORAL_TABLET | Freq: Every day | ORAL | 3 refills | Status: DC

## 2018-07-11 NOTE — Progress Notes (Signed)
   Subjective:    Patient ID: Jocelyn Bond, female    DOB: 05-Jan-1956, 63 y.o.   MRN: [id number]  HPI She is here for recheck on her blood pressure.  Repeat blood pressure reading did show good readings.  Review of record also indicates need for another shingles vaccine.  Review of Systems     Objective:   Physical Exam Alert and in no distress.  Blood pressure is recorded.      Assessment & Plan:  Essential hypertension - Plan: losartan-hydrochlorothiazide (HYZAAR) 50-12.5 MG tablet  Need for shingles vaccine - Plan: Varicella-zoster vaccine IM (Shingrix) Hyzaar given for a full year.  She will set up for complete exam including Pap later this year.

## 2018-07-21 ENCOUNTER — Telehealth: Payer: Self-pay | Admitting: Family Medicine

## 2018-07-21 NOTE — Telephone Encounter (Signed)
[facility] states combo tablet unavailable.  Please send 2 rx 1 Losartan 50 mg 2 HCTZ 12.5 mg

## 2018-07-23 MED ORDER — HYDROCHLOROTHIAZIDE 12.5 MG PO CAPS: 12.5000 mg | ORAL_CAPSULE | Freq: Every day | ORAL | 1 refills | Status: DC

## 2018-07-23 MED ORDER — LOSARTAN POTASSIUM 50 MG PO TABS: 50.0000 mg | ORAL_TABLET | Freq: Every day | ORAL | 1 refills | Status: DC

## 2018-09-04 ENCOUNTER — Encounter: Payer: Self-pay | Admitting: Family Medicine

## 2018-09-04 ENCOUNTER — Ambulatory Visit
Admission: RE | Admit: 2018-09-04 | Discharge: 2018-09-04 | Disposition: A | Discharge status: Home / Self Care | Payer: BC Managed Care – PPO | Source: Ambulatory Visit | Attending: Family Medicine | Admitting: Family Medicine

## 2018-09-04 ENCOUNTER — Ambulatory Visit: Payer: BC Managed Care – PPO | Admitting: Family Medicine

## 2018-09-04 VITALS — BP 138/88 | HR 94 | Temp 98.9°F | Wt 241.8 lb

## 2018-09-04 DIAGNOSIS — J209 Acute bronchitis, unspecified: Secondary | ICD-10-CM | POA: Diagnosis not present

## 2018-09-04 MED ORDER — PREDNISONE 10 MG (48) PO TBPK: ORAL_TABLET | ORAL | 0 refills | Status: DC

## 2018-09-04 MED ORDER — LEVOFLOXACIN 500 MG PO TABS: 500.0000 mg | ORAL_TABLET | Freq: Every day | ORAL | 0 refills | Status: DC

## 2018-09-04 NOTE — Progress Notes (Signed)
   Subjective:    Patient ID: Jocelyn Bond, female    DOB: [birth year], 63 y.o.   MRN: [id number]  HPI She is here for a recheck.  She was seen October 7 and treated for bronchitis.  She got better but not over it and again called for a refill on October 28.  At that time she was given Augmentin.  She states that she got roughly 80% better but the symptoms have been intermittent since then and then reappeared several days ago.  She complains of intermittent cough, headache, rhinorrhea but no fever, chills, sore throat or shortness of breath.   Review of Systems     Objective:   Physical Exam Alert and in no distress. Tympanic membranes and canals are normal. Pharyngeal area is normal. Neck is supple without adenopathy or thyromegaly. Cardiac exam shows a regular sinus rhythm without murmurs or gallops. Lungs are clear to auscultation.        Assessment & Plan:  Acute bronchitis, unspecified organism - Plan: DG Chest 2 View, levofloxacin (LEVAQUIN) 500 MG tablet, predniSONE (STERAPRED UNI-PAK 48 TAB) 10 MG (48) TBPK tablet I explained that even if she has pneumonia, the therapy would be the same.  Cautioned her to call me if she is not entirely better when she finishes the antibiotic so this does not continue to linger.  She expressed understanding of this.

## 2018-09-05 ENCOUNTER — Other Ambulatory Visit: Payer: Self-pay | Admitting: Family Medicine

## 2018-09-05 DIAGNOSIS — J209 Acute bronchitis, unspecified: Secondary | ICD-10-CM

## 2018-09-27 ENCOUNTER — Other Ambulatory Visit: Payer: Self-pay

## 2018-09-27 ENCOUNTER — Telehealth (INDEPENDENT_AMBULATORY_CARE_PROVIDER_SITE_OTHER): Payer: BC Managed Care – PPO | Admitting: Family Medicine

## 2018-09-27 DIAGNOSIS — J341 Cyst and mucocele of nose and nasal sinus: Secondary | ICD-10-CM

## 2018-09-27 DIAGNOSIS — J209 Acute bronchitis, unspecified: Secondary | ICD-10-CM

## 2018-09-27 MED ORDER — LEVOFLOXACIN 500 MG PO TABS: 500.0000 mg | ORAL_TABLET | Freq: Every day | ORAL | 0 refills | Status: DC

## 2018-09-27 NOTE — Progress Notes (Signed)
Documentation for Telephone encounter:  This telephone service is not related to other E/M service within previous 7 days.  Patient consented to the consult.  This telephone consult involved patient  and myself, she finished her antibiotic and states that she is roughly 90% better but still having difficulty with cough.  No fever, chills, shortness of breath, earache or sore throat. Recently she also saw her dentist and apparently x-rays did show a cyst in the right maxillary sinus that needs follow-up.    Objective: Alert and in no distress.  Voice sounded normal.   Assessment and plan Cyst of maxillary sinus - Plan: CT Maxillofacial W/Cm  Acute bronchitis, unspecified organism - Plan: levofloxacin (LEVAQUIN) 500 MG tablet      They were advised to follow up she will call me when she finishes the antibiotic to let me know how she is doing. The CT of his sinuses was scheduled later due to coronavirus.  She was comfortable with that.   Time involving medical discussion was 12 minutes.

## 2018-11-09 ENCOUNTER — Other Ambulatory Visit: Payer: Self-pay | Admitting: Family Medicine

## 2018-11-09 DIAGNOSIS — I10 Essential (primary) hypertension: Secondary | ICD-10-CM

## 2018-12-05 ENCOUNTER — Ambulatory Visit (HOSPITAL_COMMUNITY)
Admission: RE | Admit: 2018-12-05 | Discharge: 2018-12-05 | Disposition: A | Discharge status: Home / Self Care | Payer: BC Managed Care – PPO | Source: Ambulatory Visit | Attending: Family Medicine | Admitting: Family Medicine

## 2018-12-05 ENCOUNTER — Other Ambulatory Visit: Payer: Self-pay

## 2018-12-05 DIAGNOSIS — J341 Cyst and mucocele of nose and nasal sinus: Secondary | ICD-10-CM | POA: Insufficient documentation

## 2018-12-05 LAB — POCT I-STAT CREATININE: Creatinine, Ser: 0.9 mg/dL (ref 0.44–1.00)

## 2018-12-05 MED ORDER — IOHEXOL 300 MG/ML  SOLN
75.0000 mL | Freq: Once | INTRAMUSCULAR | Status: AC | PRN
  Administered 2018-12-05: 75 mL via INTRAVENOUS

## 2019-05-13 ENCOUNTER — Other Ambulatory Visit: Payer: Self-pay | Admitting: Family Medicine

## 2019-05-16 ENCOUNTER — Other Ambulatory Visit: Payer: Self-pay | Admitting: Family Medicine

## 2019-05-17 ENCOUNTER — Other Ambulatory Visit: Payer: Self-pay | Admitting: Family Medicine

## 2019-05-17 NOTE — Telephone Encounter (Signed)
Pt has an appt in december 

## 2019-06-20 ENCOUNTER — Other Ambulatory Visit: Payer: Self-pay

## 2019-06-20 ENCOUNTER — Ambulatory Visit (INDEPENDENT_AMBULATORY_CARE_PROVIDER_SITE_OTHER): Payer: BC Managed Care – PPO | Admitting: Family Medicine

## 2019-06-20 ENCOUNTER — Other Ambulatory Visit (HOSPITAL_COMMUNITY)
Admission: RE | Admit: 2019-06-20 | Discharge: 2019-06-20 | Disposition: A | Discharge status: Home / Self Care | Payer: BC Managed Care – PPO | Source: Ambulatory Visit | Attending: Family Medicine | Admitting: Family Medicine

## 2019-06-20 ENCOUNTER — Encounter: Payer: Self-pay | Admitting: Family Medicine

## 2019-06-20 VITALS — BP 124/84 | HR 100 | Temp 97.8°F | Ht 65.0 in | Wt 226.0 lb

## 2019-06-20 DIAGNOSIS — L821 Other seborrheic keratosis: Secondary | ICD-10-CM

## 2019-06-20 DIAGNOSIS — Z1211 Encounter for screening for malignant neoplasm of colon: Secondary | ICD-10-CM | POA: Diagnosis not present

## 2019-06-20 DIAGNOSIS — Z0000 Encounter for general adult medical examination without abnormal findings: Secondary | ICD-10-CM | POA: Diagnosis not present

## 2019-06-20 DIAGNOSIS — Z124 Encounter for screening for malignant neoplasm of cervix: Secondary | ICD-10-CM | POA: Insufficient documentation

## 2019-06-20 DIAGNOSIS — I10 Essential (primary) hypertension: Secondary | ICD-10-CM | POA: Diagnosis not present

## 2019-06-20 DIAGNOSIS — E669 Obesity, unspecified: Secondary | ICD-10-CM

## 2019-06-20 MED ORDER — LOSARTAN POTASSIUM 50 MG PO TABS: 50.0000 mg | ORAL_TABLET | Freq: Every day | ORAL | 3 refills | Status: DC

## 2019-06-20 NOTE — Progress Notes (Signed)
Subjective:    Patient ID: Jocelyn Bond, female    DOB: Oct 19, 1955, 63 y.o.   MRN: [id number]  HPI She is here for a complete examination.  She does have underlying hypertension and presently is on losartan/HCTZ.  Apparently she is not taking the HCTZ.  She does have a mole present on the torso that she would like evaluated.  Her work keeps her busy working a regular as well as a part-time job and she has lost over 20 pounds over the last 6 months and is happy with this.  She has no particular concerns or complaints.  Family and social history as well as health maintenance and immunizations was reviewed.   Review of Systems  All other systems reviewed and are negative.      Objective:   Physical Exam BP 124/84   Pulse 100   Temp 97.8 F (36.6 C)   Ht 5\' 5"  (1.651 m)   Wt 226 lb (102.5 kg)   BMI 37.61 kg/m   General Appearance:    Alert, cooperative, no distress, appears stated age  Head:    Normocephalic, without obvious abnormality, atraumatic  Eyes:    PERRL, conjunctiva/corneas clear, EOM's intact, fundi    benign  Ears:    Normal TM's and external ear canals  Nose:   Nares normal, mucosa normal, no drainage or sinus   tenderness  Throat:   Lips, mucosa, and tongue normal; teeth and gums normal  Neck:   Supple, no lymphadenopathy;  thyroid:  no   enlargement/tenderness/nodules; no carotid   bruit or JVD     Lungs:     Clear to auscultation bilaterally without wheezes, rales or     ronchi; respirations unlabored  Chest Wall:    No tenderness or deformity   Heart:    Regular rate and rhythm, S1 and S2 normal, no murmur, rub   or gallop     Abdomen:     Soft, non-tender, nondistended, normoactive bowel sounds,    no masses, no hepatosplenomegaly  Genitalia:    Normal external genitalia without lesions.  BUS and vagina normal; cervix without lesions, or cervical motion tenderness. No abnormal vaginal discharge.  Uterus and adnexa not enlarged, nontender, no masses.   Pap performed     Extremities:   No clubbing, cyanosis or edema  Pulses:   2+ and symmetric all extremities  Skin:   Skin color, texture, turgor normal, no rashes a 1 x 1-1/2 cm raised pigmented lesion with well-demarcated borders is noted on right torso.  Lymph nodes:   Cervical, supraclavicular, and axillary nodes normal  Neurologic:   CNII-XII intact, normal strength, sensation and gait; reflexes 2+ and symmetric throughout          Psych:   Normal mood, affect, hygiene and grooming.         Assessment & Plan:  Routine general medical examination at a health care facility - Plan: CBC with Differential, Comprehensive metabolic panel, Lipid panel  Screening for colon cancer - Plan: Ambulatory referral to Gastroenterology, CANCELED: Cytology - PAP([place])  Essential hypertension - Plan: losartan (COZAAR) 50 MG tablet  Obesity (BMI 30-39.9)  Seborrheic keratosis  Screening for cervical cancer - Plan: Cytology - PAP([place]) She will continue on her present medication regimen.  I congratulated her on her weight loss.  Discussed the fact that the seborrheic keratosis is very common and that she is to expect to see more of them. Offered to do Cologuard however she  did not want to do this and would prefer to go for colonoscopy.

## 2019-06-21 LAB — CBC WITH DIFFERENTIAL/PLATELET
Basophils Absolute: 0.1 10*3/uL (ref 0.0–0.2)
Basos: 1 %
EOS (ABSOLUTE): 0.3 10*3/uL (ref 0.0–0.4)
Eos: 4 %
Hematocrit: 42.2 % (ref 34.0–46.6)
Hemoglobin: 13.4 g/dL (ref 11.1–15.9)
Immature Grans (Abs): 0 10*3/uL (ref 0.0–0.1)
Immature Granulocytes: 0 %
Lymphocytes Absolute: 2.7 10*3/uL (ref 0.7–3.1)
Lymphs: 29 %
MCH: 27.6 pg (ref 26.6–33.0)
MCHC: 31.8 g/dL (ref 31.5–35.7)
MCV: 87 fL (ref 79–97)
Monocytes Absolute: 1.6 10*3/uL — ABNORMAL HIGH (ref 0.1–0.9)
Monocytes: 17 %
Neutrophils Absolute: 4.5 10*3/uL (ref 1.4–7.0)
Neutrophils: 49 %
Platelets: 276 10*3/uL (ref 150–450)
RBC: 4.85 x10E6/uL (ref 3.77–5.28)
RDW: 13.8 % (ref 11.7–15.4)
WBC: 9.1 10*3/uL (ref 3.4–10.8)

## 2019-06-21 LAB — COMPREHENSIVE METABOLIC PANEL
ALT: 14 IU/L (ref 0–32)
AST: 21 IU/L (ref 0–40)
Albumin/Globulin Ratio: 1.1 — ABNORMAL LOW (ref 1.2–2.2)
Albumin: 3.7 g/dL — ABNORMAL LOW (ref 3.8–4.8)
Alkaline Phosphatase: 95 IU/L (ref 39–117)
BUN/Creatinine Ratio: 18 (ref 12–28)
BUN: 18 mg/dL (ref 8–27)
Bilirubin Total: 0.4 mg/dL (ref 0.0–1.2)
CO2: 26 mmol/L (ref 20–29)
Calcium: 9.3 mg/dL (ref 8.7–10.3)
Chloride: 99 mmol/L (ref 96–106)
Creatinine, Ser: 0.99 mg/dL (ref 0.57–1.00)
GFR calc Af Amer: 71 mL/min/{1.73_m2} (ref >59)
GFR calc non Af Amer: 61 mL/min/{1.73_m2} (ref >59)
Globulin, Total: 3.3 g/dL (ref 1.5–4.5)
Glucose: 83 mg/dL (ref 65–99)
Potassium: 4.1 mmol/L (ref 3.5–5.2)
Sodium: 139 mmol/L (ref 134–144)
Total Protein: 7 g/dL (ref 6.0–8.5)

## 2019-06-21 LAB — LIPID PANEL
Chol/HDL Ratio: 3 ratio (ref 0.0–4.4)
Cholesterol, Total: 198 mg/dL (ref 100–199)
HDL: 66 mg/dL (ref >39)
LDL Chol Calc (NIH): 121 mg/dL — ABNORMAL HIGH (ref 0–99)
Triglycerides: 60 mg/dL (ref 0–149)
VLDL Cholesterol Cal: 11 mg/dL (ref 5–40)

## 2019-06-22 LAB — CYTOLOGY - PAP: Diagnosis: NEGATIVE

## 2019-07-20 ENCOUNTER — Encounter: Payer: Self-pay | Admitting: Gastroenterology

## 2019-07-31 ENCOUNTER — Other Ambulatory Visit: Payer: Self-pay

## 2019-07-31 ENCOUNTER — Ambulatory Visit (AMBULATORY_SURGERY_CENTER): Payer: Self-pay | Admitting: *Deleted

## 2019-07-31 VITALS — Temp 97.1°F | Ht 65.0 in | Wt 225.0 lb

## 2019-07-31 DIAGNOSIS — Z1211 Encounter for screening for malignant neoplasm of colon: Secondary | ICD-10-CM

## 2019-07-31 DIAGNOSIS — Z01818 Encounter for other preprocedural examination: Secondary | ICD-10-CM

## 2019-07-31 NOTE — Progress Notes (Signed)

## 2019-08-06 ENCOUNTER — Encounter: Payer: Self-pay | Admitting: Gastroenterology

## 2019-08-07 ENCOUNTER — Other Ambulatory Visit: Payer: Self-pay | Admitting: Gastroenterology

## 2019-08-07 ENCOUNTER — Ambulatory Visit (INDEPENDENT_AMBULATORY_CARE_PROVIDER_SITE_OTHER): Payer: BC Managed Care – PPO

## 2019-08-07 DIAGNOSIS — Z1159 Encounter for screening for other viral diseases: Secondary | ICD-10-CM

## 2019-08-08 LAB — SARS CORONAVIRUS 2 (TAT 6-24 HRS): SARS Coronavirus 2: NEGATIVE

## 2019-08-10 ENCOUNTER — Other Ambulatory Visit: Payer: Self-pay

## 2019-08-10 ENCOUNTER — Encounter: Payer: Self-pay | Admitting: Gastroenterology

## 2019-08-10 ENCOUNTER — Ambulatory Visit (AMBULATORY_SURGERY_CENTER): Payer: BC Managed Care – PPO | Admitting: Gastroenterology

## 2019-08-10 VITALS — BP 136/91 | HR 70 | Temp 97.7°F | Resp 19 | Ht 65.0 in | Wt 225.0 lb

## 2019-08-10 DIAGNOSIS — K5730 Diverticulosis of large intestine without perforation or abscess without bleeding: Secondary | ICD-10-CM

## 2019-08-10 DIAGNOSIS — K621 Rectal polyp: Secondary | ICD-10-CM | POA: Diagnosis not present

## 2019-08-10 DIAGNOSIS — Z1211 Encounter for screening for malignant neoplasm of colon: Secondary | ICD-10-CM

## 2019-08-10 DIAGNOSIS — D128 Benign neoplasm of rectum: Secondary | ICD-10-CM

## 2019-08-10 MED ORDER — SODIUM CHLORIDE 0.9 % IV SOLN: 500.0000 mL | Freq: Once | INTRAVENOUS | Status: DC

## 2019-08-10 NOTE — Patient Instructions (Signed)
YOU HAD AN ENDOSCOPIC PROCEDURE TODAY AT [facility]:   Refer to the procedure report that was given to you for any specific questions about what was found during the examination.  If the procedure report does not answer your questions, please call your gastroenterologist to clarify.  If you requested that your care partner not be given the details of your procedure findings, then the procedure report has been included in a sealed envelope for you to review at your convenience later.  **Handoutsgiven on polyps, diverticulosis**  YOU SHOULD EXPECT: Some feelings of bloating in the abdomen. Passage of more gas than usual.  Walking can help get rid of the air that was put into your GI tract during the procedure and reduce the bloating. If you had a lower endoscopy (such as a colonoscopy or flexible sigmoidoscopy) you may notice spotting of blood in your stool or on the toilet paper. If you underwent a bowel prep for your procedure, you may not have a normal bowel movement for a few days.  Please Note:  You might notice some irritation and congestion in your nose or some drainage.  This is from the oxygen used during your procedure.  There is no need for concern and it should clear up in a day or so.  SYMPTOMS TO REPORT IMMEDIATELY:   Following lower endoscopy (colonoscopy or flexible sigmoidoscopy):  Excessive amounts of blood in the stool  Significant tenderness or worsening of abdominal pains  Swelling of the abdomen that is new, acute  Fever of 100F or higher   For urgent or emergent issues, a gastroenterologist can be reached at any hour by calling [phone].   DIET:  We do recommend a small meal at first, but then you may proceed to your regular diet.  Drink plenty of fluids but you should avoid alcoholic beverages for 24 hours.  ACTIVITY:  You should plan to take it easy for the rest of today and you should NOT DRIVE or use heavy machinery until tomorrow (because of  the sedation medicines used during the test).    FOLLOW UP: Our staff will call the number listed on your records 48-72 hours following your procedure to check on you and address any questions or concerns that you may have regarding the information given to you following your procedure. If we do not reach you, we will leave a message.  We will attempt to reach you two times.  During this call, we will ask if you have developed any symptoms of COVID 19. If you develop any symptoms (ie: fever, flu-like symptoms, shortness of breath, cough etc.) before then, please call [phone].  If you test positive for Covid 19 in the 2 weeks post procedure, please call and report this information to [place].    If any biopsies were taken you will be contacted by phone or by letter within the next 1-3 weeks.  Please call us at [phone] if you have not heard about the biopsies in 3 weeks.    SIGNATURES/CONFIDENTIALITY: You and/or your care partner have signed paperwork which will be entered into your electronic medical record.  These signatures attest to the fact that that the information above on your After Visit Summary has been reviewed and is understood.  Full responsibility of the confidentiality of this discharge information lies with you and/or your care-partner.

## 2019-08-10 NOTE — Progress Notes (Signed)
Temp by LC Vitals by CW   Pt's states no medical or surgical changes since previsit or office visit.  

## 2019-08-10 NOTE — Progress Notes (Signed)
To PACU, VSS. Report to [email or web address] 

## 2019-08-10 NOTE — Op Note (Signed)
[facility] Patient Name: Jocelyn Bond Procedure Date: 08/10/2019 10:28 AM MRN: [id number] Endoscopist: Milus Banister , MD Age: 64 Referring MD:  Date of Birth: 12-Jan-1956 Gender: Female Account #: [id number] Procedure:                Colonoscopy Indications:              Screening for colorectal malignant neoplasm;                            colonoscopy at [facility] 12-13 years ago, no results                            available at the time of this procedure, per                            patient polyps were removed. Medicines:                Monitored Anesthesia Care Procedure:                Pre-Anesthesia Assessment:                           - Prior to the procedure, a History and Physical                            was performed, and patient medications and                            allergies were reviewed. The patient's tolerance of                            previous anesthesia was also reviewed. The risks                            and benefits of the procedure and the sedation                            options and risks were discussed with the patient.                            All questions were answered, and informed consent                            was obtained. Prior Anticoagulants: The patient has                            taken no previous anticoagulant or antiplatelet                            agents. ASA Grade Assessment: II - A patient with                            mild systemic disease. After reviewing the risks  and benefits, the patient was deemed in                            satisfactory condition to undergo the procedure.                           After obtaining informed consent, the colonoscope                            was passed under direct vision. Throughout the                            procedure, the patient's blood pressure, pulse, and                            oxygen saturations were monitored  continuously. The                            Colonoscope was introduced through the anus and                            advanced to the the cecum, identified by                            appendiceal orifice and ileocecal valve. The                            colonoscopy was performed without difficulty. The                            patient tolerated the procedure well. The quality                            of the bowel preparation was good. The ileocecal                            valve, appendiceal orifice, and rectum were                            photographed. Scope In: 11:00:33 AM Scope Out: 11:16:34 AM Scope Withdrawal Time: 0 hours 11 minutes 42 seconds  Total Procedure Duration: 0 hours 16 minutes 1 second  Findings:                 A 2 mm polyp was found in the proximal rectum. The                            polyp was sessile. The polyp was removed with a                            cold snare. Resection and retrieval were complete.                           Multiple small-mouthed diverticula were found in  the left colon.                           The exam was otherwise without abnormality on                            direct and retroflexion views. Complications:            No immediate complications. Estimated blood loss:                            None. Estimated Blood Loss:     Estimated blood loss: none. Impression:               - One 2 mm polyp in the proximal rectum, removed                            with a cold snare. Resected and retrieved.                           - Diverticulosis in the left colon.                           - The examination was otherwise normal on direct                            and retroflexion views. Recommendation:           - Patient has a contact number available for                            emergencies. The signs and symptoms of potential                            delayed complications were discussed with  the                            patient. Return to normal activities tomorrow.                            Written discharge instructions were provided to the                            patient.                           - Resume previous diet.                           - Continue present medications.                           - Await pathology results. Milus Banister, MD 08/10/2019 11:18:31 AM This report has been signed electronically.

## 2019-08-10 NOTE — Progress Notes (Signed)
Called to room to assist during endoscopic procedure.  Patient ID and intended procedure confirmed with present staff. Received instructions for my participation in the procedure from the performing physician.  

## 2019-08-14 ENCOUNTER — Telehealth: Payer: Self-pay | Admitting: *Deleted

## 2019-08-14 NOTE — Telephone Encounter (Signed)
Left message on [facility] ([facility] #) that we were f/u w/ Grethel

## 2019-08-14 NOTE — Telephone Encounter (Signed)
Attempted f/u phone call. No answer. Left message. °

## 2019-08-15 ENCOUNTER — Encounter: Payer: Self-pay | Admitting: Gastroenterology

## 2019-09-13 ENCOUNTER — Ambulatory Visit: Payer: BC Managed Care – PPO | Attending: Family

## 2019-09-13 DIAGNOSIS — Z23 Encounter for immunization: Secondary | ICD-10-CM

## 2019-09-13 NOTE — Progress Notes (Signed)
   Covid-19 Vaccination Clinic  Name:  Jocelyn Bond    MRN: [id number] DOB: [birth year]  09/13/2019  [name] was observed post Covid-19 immunization for 15 minutes without incident. She was provided with Vaccine Information Sheet and instruction to access the V-Safe system.   [name] was instructed to call 911 with any severe reactions post vaccine: Marland Kitchen Difficulty breathing  . Swelling of face and throat  . A fast heartbeat  . A bad rash all over body  . Dizziness and weakness   Immunizations Administered    Name Date Dose VIS Date Route   Moderna COVID-19 Vaccine 09/13/2019  3:18 PM 0.5 mL 06/05/2019 Intramuscular   Manufacturer: Moderna   Lot: [id number]   [id number]

## 2019-10-01 ENCOUNTER — Telehealth: Payer: Self-pay

## 2019-10-01 MED ORDER — HYDROCHLOROTHIAZIDE 12.5 MG PO CAPS: 12.5000 mg | ORAL_CAPSULE | Freq: Every day | ORAL | 0 refills | Status: DC

## 2019-10-01 NOTE — Telephone Encounter (Signed)
Pt. Called stating that she picked up her Losartan at the pharmacy and usually she get HCTZ as well but it was not at the pharmacy. She was wondering if you took her off of the hctz and if she is still on it she needs a refill on it.

## 2019-10-16 ENCOUNTER — Ambulatory Visit: Payer: BC Managed Care – PPO | Attending: Family

## 2019-10-16 DIAGNOSIS — Z23 Encounter for immunization: Secondary | ICD-10-CM

## 2019-10-16 NOTE — Progress Notes (Signed)
   Covid-19 Vaccination Clinic  Name:  Jocelyn Bond    MRN: [id number] DOB: [birth year]  10/16/2019  [name] was observed post Covid-19 immunization for 15 minutes without incident. She was provided with Vaccine Information Sheet and instruction to access the V-Safe system.   [name] was instructed to call 911 with any severe reactions post vaccine: Marland Kitchen Difficulty breathing  . Swelling of face and throat  . A fast heartbeat  . A bad rash all over body  . Dizziness and weakness   Immunizations Administered    Name Date Dose VIS Date Route   Moderna COVID-19 Vaccine 10/16/2019  3:10 PM 0.5 mL 06/05/2019 Intramuscular   Manufacturer: Moderna   Lot: [id number]   [id number]

## 2020-01-09 ENCOUNTER — Other Ambulatory Visit: Payer: Self-pay | Admitting: Family Medicine

## 2020-01-12 ENCOUNTER — Other Ambulatory Visit: Payer: Self-pay | Admitting: Family Medicine

## 2020-06-09 ENCOUNTER — Ambulatory Visit: Payer: BC Managed Care – PPO | Attending: Internal Medicine

## 2020-06-09 DIAGNOSIS — Z23 Encounter for immunization: Secondary | ICD-10-CM

## 2020-06-09 NOTE — Progress Notes (Signed)
   Covid-19 Vaccination Clinic  Name:  Jocelyn Bond    MRN: [id number] DOB: 20-Jul-1955  06/09/2020  [name] was observed post Covid-19 immunization for 15 minutes without incident. She was provided with Vaccine Information Sheet and instruction to access the V-Safe system.   [name] was instructed to call 911 with any severe reactions post vaccine: Marland Kitchen Difficulty breathing  . Swelling of face and throat  . A fast heartbeat  . A bad rash all over body  . Dizziness and weakness   Immunizations Administered    No immunizations on file.

## 2020-06-12 ENCOUNTER — Ambulatory Visit (INDEPENDENT_AMBULATORY_CARE_PROVIDER_SITE_OTHER): Payer: BC Managed Care – PPO | Admitting: Family Medicine

## 2020-06-12 ENCOUNTER — Other Ambulatory Visit: Payer: Self-pay

## 2020-06-12 VITALS — BP 118/82 | HR 91 | Temp 98.6°F | Wt 231.4 lb

## 2020-06-12 DIAGNOSIS — Z23 Encounter for immunization: Secondary | ICD-10-CM | POA: Diagnosis not present

## 2020-06-12 DIAGNOSIS — R059 Cough, unspecified: Secondary | ICD-10-CM

## 2020-06-12 NOTE — Patient Instructions (Signed)
Take Nexium daily for the next couple of weeks to see what it will do Stop the losartan for a week or so and let me know how these 2 things work

## 2020-06-12 NOTE — Progress Notes (Signed)
   Subjective:    Patient ID: York Spaniel, female    DOB: 02/19/1956, 64 y.o.   MRN: [id number]  HPI She complains of an over 1 year history of intermittent cough.  No fever, chills, sore throat.  She has no underlying allergies.  She cannot relate this to any position.  Foods cause no trouble.  She is on a blood pressure medication.   Review of Systems     Objective:   Physical Exam  Alert and in no distress. Tympanic membranes and canals are normal. Pharyngeal area is normal. Neck is supple without adenopathy or thyromegaly. Cardiac exam shows a regular sinus rhythm without murmurs or gallops. Lungs are clear to auscultation.       Assessment & Plan:  Cough  Need for influenza vaccination - Plan: Flu Vaccine QUAD 6+ mos PF IM (Fluarix Quad PF) I explained that the etiology behind the cough is unclear and we will start with Lasix.  If this does not work, will refer to pulmonary for further evaluation.  She was comfortable with that. Take Nexium daily for the next couple of weeks to see what it will do Stop the losartan for a week or so and let me know how these 2 things work

## 2020-06-23 ENCOUNTER — Ambulatory Visit (INDEPENDENT_AMBULATORY_CARE_PROVIDER_SITE_OTHER): Payer: BC Managed Care – PPO | Admitting: Family Medicine

## 2020-06-23 ENCOUNTER — Encounter: Payer: Self-pay | Admitting: Family Medicine

## 2020-06-23 ENCOUNTER — Other Ambulatory Visit: Payer: Self-pay

## 2020-06-23 VITALS — BP 132/86 | HR 78 | Temp 98.2°F | Ht 64.0 in | Wt 229.2 lb

## 2020-06-23 DIAGNOSIS — E669 Obesity, unspecified: Secondary | ICD-10-CM | POA: Diagnosis not present

## 2020-06-23 DIAGNOSIS — I872 Venous insufficiency (chronic) (peripheral): Secondary | ICD-10-CM

## 2020-06-23 DIAGNOSIS — I10 Essential (primary) hypertension: Secondary | ICD-10-CM

## 2020-06-23 DIAGNOSIS — Z0000 Encounter for general adult medical examination without abnormal findings: Secondary | ICD-10-CM | POA: Diagnosis not present

## 2020-06-23 DIAGNOSIS — L821 Other seborrheic keratosis: Secondary | ICD-10-CM

## 2020-06-23 NOTE — Progress Notes (Signed)
° °  Subjective:    Patient ID: Jocelyn Bond, female    DOB: 06/19/1956, 64 y.o.   MRN: [id number]  HPI She is here for complete examination.  She has stopped taking her losartan for her possible medication related cough and she thinks that she is maybe 50% better.  She did make the mistake and eat approximately 2 hours ago.  I explained the we could still look at blood work.  She continues on several over-the-counter medications but otherwise seems to be doing well.  She plans to start an exercise program at the beginning of the year.  She does have evidence of seborrheic dermatitis but none causing any difficulty.  She will be babysitting for one of her grandchildren.  Family and social history as well as health maintenance and immunizations was reviewed.   Review of Systems  All other systems reviewed and are negative.      Objective:   Physical Exam Alert and in no distress. Tympanic membranes and canals are normal. Pharyngeal area is normal. Neck is supple without adenopathy or thyromegaly. Cardiac exam shows a regular sinus rhythm without murmurs or gallops. Lungs are clear to auscultation.        Assessment & Plan:  Routine general medical examination at a health care facility - Plan: CBC with Differential/Platelet, Comprehensive metabolic panel, Lipid panel  Essential hypertension - Plan: CBC with Differential/Platelet, Comprehensive metabolic panel  Chronic venous insufficiency  Obesity (BMI 30-39.9)  Seborrheic keratosis No therapy needed for the venous insufficiency or seborrheic keratoses.  She will call me in 1 week to let me know how she is doing with the cough.  There is still a question of whether this is truly related to her blood pressure medication. I then discussed diet and exercise with her.  Recommend 20 minutes of something physical daily or 150 minutes a week of something as well as cutting back on carbohydrates.

## 2020-06-24 LAB — COMPREHENSIVE METABOLIC PANEL
ALT: 17 IU/L (ref 0–32)
AST: 24 IU/L (ref 0–40)
Albumin/Globulin Ratio: 1 — ABNORMAL LOW (ref 1.2–2.2)
Albumin: 3.6 g/dL — ABNORMAL LOW (ref 3.8–4.8)
Alkaline Phosphatase: 79 IU/L (ref 44–121)
BUN/Creatinine Ratio: 10 — ABNORMAL LOW (ref 12–28)
BUN: 11 mg/dL (ref 8–27)
Bilirubin Total: 0.4 mg/dL (ref 0.0–1.2)
CO2: 25 mmol/L (ref 20–29)
Calcium: 8.9 mg/dL (ref 8.7–10.3)
Chloride: 101 mmol/L (ref 96–106)
Creatinine, Ser: 1.11 mg/dL — ABNORMAL HIGH (ref 0.57–1.00)
GFR calc Af Amer: 61 mL/min/{1.73_m2} (ref >59)
GFR calc non Af Amer: 53 mL/min/{1.73_m2} — ABNORMAL LOW (ref >59)
Globulin, Total: 3.5 g/dL (ref 1.5–4.5)
Glucose: 84 mg/dL (ref 65–99)
Potassium: 3.7 mmol/L (ref 3.5–5.2)
Sodium: 141 mmol/L (ref 134–144)
Total Protein: 7.1 g/dL (ref 6.0–8.5)

## 2020-06-24 LAB — CBC WITH DIFFERENTIAL/PLATELET
Basophils Absolute: 0.1 10*3/uL (ref 0.0–0.2)
Basos: 1 %
EOS (ABSOLUTE): 0.3 10*3/uL (ref 0.0–0.4)
Eos: 4 %
Hematocrit: 41.4 % (ref 34.0–46.6)
Hemoglobin: 13.6 g/dL (ref 11.1–15.9)
Immature Grans (Abs): 0 10*3/uL (ref 0.0–0.1)
Immature Granulocytes: 0 %
Lymphocytes Absolute: 2.3 10*3/uL (ref 0.7–3.1)
Lymphs: 30 %
MCH: 29.2 pg (ref 26.6–33.0)
MCHC: 32.9 g/dL (ref 31.5–35.7)
MCV: 89 fL (ref 79–97)
Monocytes Absolute: 1.1 10*3/uL — ABNORMAL HIGH (ref 0.1–0.9)
Monocytes: 14 %
Neutrophils Absolute: 3.9 10*3/uL (ref 1.4–7.0)
Neutrophils: 51 %
Platelets: 273 10*3/uL (ref 150–450)
RBC: 4.65 x10E6/uL (ref 3.77–5.28)
RDW: 13.2 % (ref 11.7–15.4)
WBC: 7.7 10*3/uL (ref 3.4–10.8)

## 2020-06-24 LAB — LIPID PANEL
Chol/HDL Ratio: 3.5 ratio (ref 0.0–4.4)
Cholesterol, Total: 213 mg/dL — ABNORMAL HIGH (ref 100–199)
HDL: 61 mg/dL (ref >39)
LDL Chol Calc (NIH): 140 mg/dL — ABNORMAL HIGH (ref 0–99)
Triglycerides: 65 mg/dL (ref 0–149)
VLDL Cholesterol Cal: 12 mg/dL (ref 5–40)

## 2020-08-30 ENCOUNTER — Other Ambulatory Visit: Payer: Self-pay | Admitting: Family Medicine

## 2020-08-30 DIAGNOSIS — I10 Essential (primary) hypertension: Secondary | ICD-10-CM

## 2020-09-30 ENCOUNTER — Encounter: Payer: Self-pay | Admitting: Family Medicine

## 2020-09-30 ENCOUNTER — Telehealth: Payer: BC Managed Care – PPO | Admitting: Family Medicine

## 2020-09-30 ENCOUNTER — Other Ambulatory Visit: Payer: Self-pay

## 2020-09-30 VITALS — Ht 65.0 in | Wt 229.0 lb

## 2020-09-30 DIAGNOSIS — R059 Cough, unspecified: Secondary | ICD-10-CM

## 2020-09-30 NOTE — Progress Notes (Signed)
   Subjective:    Patient ID: Jocelyn Bond, female    DOB: Jan 23, 1956, 65 y.o.   MRN: [id number]  HPI Documentation for virtual audio and video telecommunications through [place] encounter: The patient was located at home. 2 patient identifiers used.  The provider was located in the office. The patient did consent to this visit and is aware of possible charges through their insurance for this visit. The other persons participating in this telemedicine service were none. Time spent on call was 5 minutes and in review of previous records >20 minutes total for counseling and coordination of care. This virtual service is not related to other E/M service within previous 7 days. Today's visit is to discuss intermittent cough.  She states that this has been going on for well over a year.  She does note that when she walks for long distances, that will make her cough but she can walk up a flight of steps and does not have difficulty with cough, shortness of breath or weakness.  She does not smoke.  Has no underlying allergies.  Cannot relate this to food intake.  She is on losartan and did stop the medicine to see if that would make a difference in the cough and it did not.   Review of Systems     Objective:   Physical Exam Alert and in no distress otherwise not examined Her medical record was reviewed.      Assessment & Plan:  Cough I explained that I would need to have her come in for an examination, blood work, EKG and chest x-ray to further evaluate this.  Recommend that she pay attention to any thing that she thinks might make her coughing worse as at this point there is no clear reason behind her cough.

## 2020-10-06 ENCOUNTER — Encounter: Payer: Self-pay | Admitting: Family Medicine

## 2020-10-06 ENCOUNTER — Ambulatory Visit (INDEPENDENT_AMBULATORY_CARE_PROVIDER_SITE_OTHER): Payer: BC Managed Care – PPO | Admitting: Family Medicine

## 2020-10-06 ENCOUNTER — Ambulatory Visit
Admission: RE | Admit: 2020-10-06 | Discharge: 2020-10-06 | Disposition: A | Discharge status: Home / Self Care | Payer: BC Managed Care – PPO | Source: Ambulatory Visit | Attending: Family Medicine | Admitting: Family Medicine

## 2020-10-06 ENCOUNTER — Other Ambulatory Visit: Payer: Self-pay

## 2020-10-06 VITALS — BP 134/80 | HR 83 | Temp 98.5°F | Wt 229.4 lb

## 2020-10-06 DIAGNOSIS — R059 Cough, unspecified: Secondary | ICD-10-CM | POA: Diagnosis not present

## 2020-10-06 DIAGNOSIS — I493 Ventricular premature depolarization: Secondary | ICD-10-CM

## 2020-10-06 NOTE — Progress Notes (Signed)
   Subjective:    Patient ID: Jocelyn Bond, female    DOB: 07-Sep-1955, 65 y.o.   MRN: [id number]  HPI She is here for consult concerning continued difficulty with coughing.  She has no associated DOE, shortness of breath, history of allergies or asthma, chest pain, weakness, dizziness.  She did stop the losartan for short period of time however it made no change in her coughing.  She has no reflux symptoms.   Review of Systems     Objective:   Physical Exam Alert and in no distress. Tympanic membranes and canals are normal. Pharyngeal area is normal. Neck is supple without adenopathy or thyromegaly. Cardiac exam shows an irregular rhythm without murmurs or gallops. Lungs are clear to auscultation. Her recent blood work was reviewed EKG read by me today shows a rate of 76 with other indices being normal.  1 PVC was noted.      Assessment & Plan:  PVC's (premature ventricular contractions) - Plan: EKG 12-Lead  Cough - Plan: DG Chest 2 View I will get a chest x-ray on her and if nothing is found, will refer to pulmonary for further evaluation of her cough.  She was comfortable with that.

## 2020-10-07 ENCOUNTER — Encounter: Payer: Self-pay | Admitting: Family Medicine

## 2020-10-07 NOTE — Addendum Note (Signed)
Addended by: Denita Lung on: 10/07/2020 10:43 AM   Modules accepted: Orders

## 2020-10-23 ENCOUNTER — Ambulatory Visit
Admission: RE | Admit: 2020-10-23 | Discharge: 2020-10-23 | Disposition: A | Discharge status: Home / Self Care | Payer: BC Managed Care – PPO | Source: Ambulatory Visit | Attending: Family Medicine | Admitting: Family Medicine

## 2020-10-23 MED ORDER — IOPAMIDOL (ISOVUE-300) INJECTION 61%
75.0000 mL | Freq: Once | INTRAVENOUS | Status: AC | PRN
  Administered 2020-10-23: 75 mL via INTRAVENOUS

## 2020-10-24 NOTE — Addendum Note (Signed)
Addended by: Denita Lung on: 10/24/2020 02:31 PM   Modules accepted: Orders

## 2020-11-10 ENCOUNTER — Other Ambulatory Visit: Payer: Self-pay

## 2020-11-10 ENCOUNTER — Telehealth: Payer: Self-pay

## 2020-11-10 DIAGNOSIS — I10 Essential (primary) hypertension: Secondary | ICD-10-CM

## 2020-11-10 MED ORDER — HYDROCHLOROTHIAZIDE 12.5 MG PO CAPS: 12.5000 mg | ORAL_CAPSULE | Freq: Every day | ORAL | 0 refills | Status: DC

## 2020-11-10 MED ORDER — LOSARTAN POTASSIUM 50 MG PO TABS: 1.0000 | ORAL_TABLET | Freq: Every day | ORAL | 0 refills | Status: DC

## 2020-11-10 NOTE — Telephone Encounter (Signed)
Pt needs refill HCTZ & Losartan to [facility].

## 2020-11-10 NOTE — Telephone Encounter (Signed)
Done Kh 

## 2020-11-17 NOTE — Telephone Encounter (Signed)
Kim handled °

## 2020-11-24 ENCOUNTER — Encounter: Payer: Self-pay | Admitting: Pulmonary Disease

## 2020-11-24 ENCOUNTER — Ambulatory Visit (INDEPENDENT_AMBULATORY_CARE_PROVIDER_SITE_OTHER): Payer: BC Managed Care – PPO | Admitting: Pulmonary Disease

## 2020-11-24 ENCOUNTER — Other Ambulatory Visit: Payer: Self-pay

## 2020-11-24 VITALS — BP 128/86 | HR 83 | Temp 98.2°F | Ht 65.0 in | Wt 228.6 lb

## 2020-11-24 DIAGNOSIS — J479 Bronchiectasis, uncomplicated: Secondary | ICD-10-CM

## 2020-11-24 DIAGNOSIS — J849 Interstitial pulmonary disease, unspecified: Secondary | ICD-10-CM | POA: Diagnosis not present

## 2020-11-24 MED ORDER — STIOLTO RESPIMAT 2.5-2.5 MCG/ACT IN AERS: 2.0000 | INHALATION_SPRAY | Freq: Every day | RESPIRATORY_TRACT | 0 refills | Status: DC

## 2020-11-24 NOTE — Patient Instructions (Signed)
We will check lab work, please come back to have that done tomorrow or later this week.   We will check pulmonary function tests   Start stiolto 2 puffs daily

## 2020-11-24 NOTE — Progress Notes (Signed)
Synopsis: Referred in May 2022 for cough  Subjective:   PATIENT ID: Jocelyn Bond GENDER: female DOB: 04-07-1956, MRN: [id number]   HPI  Chief Complaint  Patient presents with  . Consult    Referred by PCP for chronic cough. States she has had the cough for a year. She will sometimes cough up thick white phlegm.    Jocelyn Mussrmelia Mathieson is a 65 year old woman, never smoker with hypertension and obesity who is referred to pulmonary clinic for chronic cough.   She reports having cough over the past year or so that can occur at any time.  She reports the cough does not wake her up from sleep though.  The cough is worse when exerting herself such as walking to her car in a parking lot.  She is coughing up white thick mucus.  She denies any issues with reflux.  She does report occasional choking episodes when eating meats.  She is a never smoker but reports her husband smoked around her for 13 years.  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  She denies any significant exposure to any harmful chemicals or dust from her work.  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  Serologic work-up in 2009 was positive for ANA with a 1: 320 titer and negative ANCA, dsDNA, SSA/SSB, scl-70 antibodies. From record review it appears she was treated with a course of prednisone per phone notes.  She reports after being treated for this pneumonia she has not had issues with her breathing and has only reported a cough over the last year.  She denies any joint pains or skin rashes.  Her sister does have history of rheumatoid arthritis.  Past Medical History:  Diagnosis Date  . Allergy    very rare   . Hypertension   . Obesity      Family History  Problem Relation Age of Onset  . Hypertension Mother   . Hypertension Father    . Cancer Sister        breast ca  . Arthritis/Rheumatoid Sister   . Colon cancer Neg Hx   . Colon polyps Neg Hx   . Esophageal cancer Neg Hx   . Rectal cancer Neg Hx   . Stomach cancer Neg Hx      Social History   Socioeconomic History  . Marital status: Single    Spouse name: Not on file  . Number of children: Not on file  . Years of education: Not on file  . Highest education level: Not on file  Occupational History  . Not on file  Tobacco Use  . Smoking status: Never Smoker  . Smokeless tobacco: Never Used  Substance and Sexual Activity  . Alcohol use: Yes    Alcohol/week: 1.0 standard drink    Types: 1 Glasses of wine per week    Comment: rare   . Drug use: No  . Sexual activity: Not Currently  Other Topics Concern  . Not on file  Social History Narrative  . Not on file   Social Determinants of Health   Financial Resource Strain: Not on file  Food Insecurity: Not on file  Transportation Needs: Not on file  Physical Activity: Not on file  Stress: Not on file  Social Connections: Not on file  Intimate Partner Violence: Not on file  No Known Allergies   Outpatient Medications Prior to Visit  Medication Sig Dispense Refill  . Ginkgo Biloba 40 MG TABS Take by mouth.    Mar[place] Kitchen glucosamine-chondroitin 500-400 MG tablet Take 1 tablet by mouth 3 (three) times daily.    . [place], Centella asiatica, (GOTU KOLA PO) Take 1 capsule by mouth.    . hydrochlorothiazide (MICROZIDE) 12.5 MG capsule Take 1 capsule (12.5 mg total) by mouth daily. 90 capsule 0  . losartan (COZAAR) 50 MG tablet Take 1 tablet (50 mg total) by mouth daily. 90 tablet 0  . Magnesium 250 MG TABS Take by mouth.    . Multiple Vitamins-Minerals (MULTIVITAMIN WITH MINERALS) tablet Take 1 tablet by mouth daily.    . ALOE PO Take 1 capsule by mouth. (Patient not taking: No sig reported)    . aspirin 81 MG chewable tablet Chew 81 mg by mouth daily. (Patient not taking: No sig reported)    . guaiFENesin  (MUCINEX) 600 MG 12 hr tablet Take 600 mg by mouth 2 (two) times daily. (Patient not taking: No sig reported)    . levofloxacin (LEVAQUIN) 500 MG tablet Take 1 tablet (500 mg total) by mouth daily. (Patient not taking: No sig reported) 10 tablet 0  . naproxen (NAPROSYN) 375 MG tablet Take 1 tablet (375 mg total) by mouth 2 (two) times daily. (Patient not taking: No sig reported) 14 tablet 0  . OVER THE COUNTER MEDICATION Maxie hair (Patient not taking: No sig reported)    . predniSONE (STERAPRED UNI-PAK 48 TAB) 10 MG (48) TBPK tablet Take as per manufacturer's recommendations (Patient not taking: No sig reported) 48 tablet 0   No facility-administered medications prior to visit.    Review of Systems  Constitutional: Negative for chills, fever, malaise/fatigue and weight loss.  HENT: Negative for congestion, sinus pain and sore throat.   Eyes: Negative.   Respiratory: Positive for cough and sputum production. Negative for hemoptysis, shortness of breath and wheezing.   Cardiovascular: Negative for chest pain, palpitations, orthopnea, claudication and leg swelling.  Gastrointestinal: Negative for abdominal pain, heartburn, nausea and vomiting.  Genitourinary: Negative.   Musculoskeletal: Negative for joint pain and myalgias.  Skin: Negative for rash.  Neurological: Negative for weakness.  Endo/Heme/Allergies: Negative.   Psychiatric/Behavioral: Negative.       Objective:   Vitals:   11/24/20 1623  BP: 128/86  Pulse: 83  Temp: 98.2 F (36.8 C)  TempSrc: Temporal  SpO2: 97%  Weight: 228 lb 9.6 oz (103.7 kg)  Height: 5\' 5"  (1.651 m)     Physical Exam Constitutional:      General: She is not in acute distress.    Appearance: She is not ill-appearing.  HENT:     Head: Normocephalic and atraumatic.     Nose: Nose normal.     Mouth/Throat:     Mouth: Mucous membranes are moist.     Pharynx: Oropharynx is clear.  Eyes:     General: No scleral icterus.    Conjunctiva/sclera:  Conjunctivae normal.     Pupils: Pupils are equal, round, and reactive to light.  Cardiovascular:     Rate and Rhythm: Normal rate and regular rhythm.     Pulses: Normal pulses.     Heart sounds: Normal heart sounds. No murmur heard.   Pulmonary:     Effort: Pulmonary effort is normal.     Breath sounds: Rales (right base) present. No wheezing or rhonchi.  Abdominal:     General:  Bowel sounds are normal.     Palpations: Abdomen is soft.  Musculoskeletal:        General: No swelling or tenderness.     Right lower leg: No edema.     Left lower leg: No edema.  Lymphadenopathy:     Cervical: No cervical adenopathy.  Skin:    General: Skin is warm and dry.     Capillary Refill: Capillary refill takes less than 2 seconds.     Findings: No lesion or rash.  Neurological:     General: No focal deficit present.     Mental Status: She is alert.  Psychiatric:        Mood and Affect: Mood normal.        Behavior: Behavior normal.        Thought Content: Thought content normal.        Judgment: Judgment normal.     CBC    Component Value Date/Time   WBC 7.7 06/23/2020 1453   WBC 7.7 12/19/2015 0825   RBC 4.65 06/23/2020 1453   RBC 4.44 12/19/2015 0825   HGB 13.6 06/23/2020 1453   HCT 41.4 06/23/2020 1453   PLT 273 06/23/2020 1453   MCV 89 06/23/2020 1453   MCH 29.2 06/23/2020 1453   MCH 28.6 12/19/2015 0825   MCHC 32.9 06/23/2020 1453   MCHC 32.4 12/19/2015 0825   RDW 13.2 06/23/2020 1453   LYMPHSABS 2.3 06/23/2020 1453   MONOABS 1,078 (H) 12/19/2015 0825   EOSABS 0.3 06/23/2020 1453   BASOSABS 0.1 06/23/2020 1453   BMP Latest Ref Rng & Units 06/23/2020 06/20/2019 12/05/2018  Glucose 65 - 99 mg/dL 84 83 -  BUN 8 - 27 mg/dL 11 18 -  Creatinine 0.57 - 1.00 mg/dL 1.11(H) 0.99 0.90  BUN/Creat Ratio 12 - 28 10(L) 18 -  Sodium 134 - 144 mmol/L 141 139 -  Potassium 3.5 - 5.2 mmol/L 3.7 4.1 -  Chloride 96 - 106 mmol/L 101 99 -  CO2 20 - 29 mmol/L 25 26 -  Calcium 8.7 - 10.3  mg/dL 8.9 9.3 -    Chest imaging: CT Chest 10/23/20 Mediastinum/Nodes: Mediastinal and hilar lymph nodes are not enlarged by CT size criteria. No axillary adenopathy. Esophagus is grossly unremarkable.  Lungs/Pleura: Peripheral and basilar predominant subpleural reticulation, ground-glass and traction bronchiectasis/bronchiolectasis. Postoperative changes in the right hemithorax. No pleural fluid. Airway is unremarkable.  PFT: No flowsheet data found.  Labs:  Path: 2009: 1,2,3. LUNG, RIGHT LOWER LOBE AND RIGHT MIDDLE LOBE OPEN,  BIOPSIES:  - BRONCHIOLITIS OBLITERANS ORGANIZING PNEUMONIA (BOOP)    Assessment & Plan:   Bronchiectasis without complication ([place]) - Plan: Pulmonary function test  ILD (interstitial lung disease) ([place]) - Plan: ANA,IFA RA Diag Pnl w/rflx Tit/Patn, ANCA Screen Reflex Titer, Cyclic citrul peptide antibody, IgG, Anti-scleroderma antibody, Rheumatoid factor, Sjogren's syndrome antibods(ssa + ssb), CK total and CKMB (cardiac)not at [facility], Allergens w/Total IgE Area 2, MyoMarker 3 Plus Profile (RDL)  Discussion: Tanai Bouler is a 65 year old woman, never smoker with hypertension, obesity and bronchiolitis obliterans organizing pneumonia in 2009 who is referred to pulmonary clinic for chronic cough.   Patient's cough is secondary to interstitial lung disease as noted on her recent CT scan 10/23/20 with evidence of subpleural reticulation and traction bronchiectasis/bronchiolectasis predominant at the bases and peripherally.  Differential is broad at this time. We will send full serologic work-up including ANA, ANCA, anti-CCP, SCL 70, rheumatoid factor, SSA and SSB antibodies, CK, hypersensitivity panel and  myositis panel.  We will obtain pulmonary function tests and trial patient on Stiolto inhaler therapy in the meantime.  She does report infrequent episodes of choking on solid foods so we will consider referral to GI in the future for possible  endoscopic evaluation.  We can also consider modified barium swallow as well.  Follow up in 2 months.  Freda Jackson, MD [facility] Office: [phone]    Current Outpatient Medications:  .  Ginkgo Biloba 40 MG TABS, Take by mouth., Disp: , Rfl:  .  glucosamine-chondroitin 500-400 MG tablet, Take 1 tablet by mouth 3 (three) times daily., Disp: , Rfl:  .  [place], Emergency planning/management officer asiatica, (GOTU KOLA PO), Take 1 capsule by mouth., Disp: , Rfl:  .  hydrochlorothiazide (MICROZIDE) 12.5 MG capsule, Take 1 capsule (12.5 mg total) by mouth daily., Disp: 90 capsule, Rfl: 0 .  losartan (COZAAR) 50 MG tablet, Take 1 tablet (50 mg total) by mouth daily., Disp: 90 tablet, Rfl: 0 .  Magnesium 250 MG TABS, Take by mouth., Disp: , Rfl:  .  Multiple Vitamins-Minerals (MULTIVITAMIN WITH MINERALS) tablet, Take 1 tablet by mouth daily., Disp: , Rfl:  .  Tiotropium Bromide-Olodaterol (STIOLTO RESPIMAT) 2.5-2.5 MCG/ACT AERS, Inhale 2 puffs into the lungs daily., Disp: 4 g, Rfl: 0

## 2020-11-24 NOTE — Progress Notes (Signed)
Patient seen in the office today and instructed on use of Stiolto.  Patient expressed understanding and demonstrated technique.  Jocelyn Bond [facility] 11/24/20

## 2020-11-28 ENCOUNTER — Other Ambulatory Visit: Payer: BC Managed Care – PPO

## 2020-11-28 ENCOUNTER — Other Ambulatory Visit: Payer: Self-pay | Admitting: Pulmonary Disease

## 2020-11-28 DIAGNOSIS — J849 Interstitial pulmonary disease, unspecified: Secondary | ICD-10-CM

## 2020-12-02 LAB — ANA,IFA RA DIAG PNL W/RFLX TIT/PATN
Anti Nuclear Antibody (ANA): POSITIVE — AB
Cyclic Citrullin Peptide Ab: <16 UNITS
Rheumatoid fact SerPl-aCnc: 40 IU/mL — ABNORMAL HIGH (ref <14)

## 2020-12-02 LAB — SJOGREN'S SYNDROME ANTIBODS(SSA + SSB)
SSA (Ro) (ENA) Antibody, IgG: <1 AI
SSB (La) (ENA) Antibody, IgG: <1 AI

## 2020-12-02 LAB — CK TOTAL AND CKMB (NOT AT ARMC): Total CK: 199 U/L — ABNORMAL HIGH (ref 29–143)

## 2020-12-02 LAB — ANTI-NUCLEAR AB-TITER (ANA TITER)
ANA TITER: 1:1280 {titer} — ABNORMAL HIGH
ANA Titer 1: 1:1280 {titer} — ABNORMAL HIGH

## 2020-12-02 LAB — ANCA SCREEN W REFLEX TITER

## 2020-12-02 LAB — ANTI-SCLERODERMA ANTIBODY: Scleroderma (Scl-70) (ENA) Antibody, IgG: <1 AI

## 2020-12-05 LAB — ALLERGENS W/TOTAL IGE AREA 2

## 2020-12-05 LAB — SPECIMEN STATUS REPORT

## 2020-12-08 ENCOUNTER — Other Ambulatory Visit (HOSPITAL_COMMUNITY)
Admission: RE | Admit: 2020-12-08 | Discharge: 2020-12-08 | Disposition: A | Discharge status: Home / Self Care | Payer: BC Managed Care – PPO | Source: Ambulatory Visit | Attending: Pulmonary Disease | Admitting: Pulmonary Disease

## 2020-12-08 DIAGNOSIS — Z20822 Contact with and (suspected) exposure to covid-19: Secondary | ICD-10-CM | POA: Insufficient documentation

## 2020-12-08 DIAGNOSIS — Z01812 Encounter for preprocedural laboratory examination: Secondary | ICD-10-CM | POA: Diagnosis not present

## 2020-12-08 LAB — SARS CORONAVIRUS 2 (TAT 6-24 HRS): SARS Coronavirus 2: NEGATIVE

## 2020-12-10 ENCOUNTER — Telehealth: Payer: Self-pay | Admitting: *Deleted

## 2020-12-10 ENCOUNTER — Other Ambulatory Visit: Payer: Self-pay

## 2020-12-10 ENCOUNTER — Ambulatory Visit (INDEPENDENT_AMBULATORY_CARE_PROVIDER_SITE_OTHER): Payer: BC Managed Care – PPO | Admitting: Pulmonary Disease

## 2020-12-10 DIAGNOSIS — J479 Bronchiectasis, uncomplicated: Secondary | ICD-10-CM

## 2020-12-10 LAB — PULMONARY FUNCTION TEST
DL/VA % pred: 88 %
DL/VA: 3.67 ml/min/mmHg/L
DLCO cor % pred: 55 %
DLCO cor: 11.5 ml/min/mmHg
DLCO unc % pred: 55 %
DLCO unc: 11.5 ml/min/mmHg
FEF 25-75 Post: 3.19 L/sec
FEF 25-75 Pre: 2.77 L/sec
FEF2575-%Change-Post: 15 %
FEF2575-%Pred-Post: 160 %
FEF2575-%Pred-Pre: 139 %
FEV1-%Change-Post: 2 %
FEV1-%Pred-Post: 104 %
FEV1-%Pred-Pre: 101 %
FEV1-Post: 2.17 L
FEV1-Pre: 2.12 L
FEV1FVC-%Change-Post: 0 %
FEV1FVC-%Pred-Pre: 113 %
FEV6-%Change-Post: 1 %
FEV6-%Pred-Post: 93 %
FEV6-%Pred-Pre: 92 %
FEV6-Post: 2.41 L
FEV6-Pre: 2.36 L
FEV6FVC-%Change-Post: 0 %
FEV6FVC-%Pred-Post: 103 %
FEV6FVC-%Pred-Pre: 103 %
FVC-%Change-Post: 1 %
FVC-%Pred-Post: 90 %
FVC-%Pred-Pre: 89 %
FVC-Post: 2.41 L
FVC-Pre: 2.37 L
Post FEV1/FVC ratio: 90 %
Post FEV6/FVC ratio: 100 %
Pre FEV1/FVC ratio: 89 %
Pre FEV6/FVC Ratio: 100 %
RV % pred: 64 %
RV: 1.36 L
TLC % pred: 72 %
TLC: 3.76 L

## 2020-12-10 MED ORDER — STIOLTO RESPIMAT 2.5-2.5 MCG/ACT IN AERS: 2.0000 | INHALATION_SPRAY | Freq: Every day | RESPIRATORY_TRACT | 0 refills | Status: DC

## 2020-12-10 MED ORDER — STIOLTO RESPIMAT 2.5-2.5 MCG/ACT IN AERS: 2.0000 | INHALATION_SPRAY | Freq: Every day | RESPIRATORY_TRACT | 1 refills | Status: DC

## 2020-12-10 NOTE — Telephone Encounter (Signed)
Pt was here in the office for PFT and was given sample of the stiolto 2.5

## 2020-12-10 NOTE — Progress Notes (Signed)
PFT done today. 

## 2020-12-18 LAB — MYOMARKER 3 PLUS PROFILE (RDL)
Anti-EJ Ab (RDL): NEGATIVE
Anti-Jo-1 Ab (RDL): <20 Units (ref <20)
Anti-Ku Ab (RDL): POSITIVE — AB
Anti-MDA-5 Ab (CADM-140)(RDL): <20 Units (ref <20)
Anti-Mi-2 Ab (RDL): NEGATIVE
Anti-NXP-2 (P140) Ab (RDL): <20 Units (ref <20)
Anti-OJ Ab (RDL): NEGATIVE
Anti-PL-12 Ab (RDL: POSITIVE — AB
Anti-PL-7 Ab (RDL): NEGATIVE
Anti-PM/Scl-100 Ab (RDL): <20 Units (ref <20)
Anti-SAE1 Ab, IgG (RDL): <20 Units (ref <20)
Anti-SRP Ab (RDL): NEGATIVE
Anti-SS-A 52kD Ab, IgG (RDL): 47 Units — ABNORMAL HIGH (ref <20)
Anti-TIF-1gamma Ab (RDL): <20 Units (ref <20)
Anti-U1 RNP Ab (RDL): 152 Units — ABNORMAL HIGH (ref <20)
Anti-U2 RNP Ab (RDL): POSITIVE — AB
Anti-U3 RNP (Fibrillarin)(RDL): NEGATIVE

## 2020-12-19 ENCOUNTER — Telehealth: Payer: Self-pay

## 2020-12-19 DIAGNOSIS — J849 Interstitial pulmonary disease, unspecified: Secondary | ICD-10-CM

## 2020-12-19 NOTE — Telephone Encounter (Signed)
-----   Message from Freddi Starr, MD sent at 12/19/2020  2:33 AM EDT ----- Regarding: Rheumatology Referral [facility] [place] or Triage Team,  Please let the patient know that her blood work has come back concerning for an autoimmune condition that is leading to interstitial lung disease which is likely causing her cough. I recommend she be seen by rheumatology for further evaluation.   Please place a referral to rheumatology for concern of myositis vs. Mixed connective tissue disease with interstitial lung disease.   Thank you, Wille Glaser

## 2020-12-19 NOTE — Telephone Encounter (Signed)
Called and spoke with patient. She is aware of the rheumatology referral and wishes to proceed with the referral. Referral has been placed.   Nothing further needed at time of call.

## 2021-01-27 ENCOUNTER — Encounter: Payer: Self-pay | Admitting: Pulmonary Disease

## 2021-01-27 ENCOUNTER — Ambulatory Visit: Payer: BC Managed Care – PPO | Admitting: Pulmonary Disease

## 2021-01-27 ENCOUNTER — Other Ambulatory Visit: Payer: Self-pay

## 2021-01-27 VITALS — BP 132/86 | HR 83 | Ht 65.0 in | Wt 221.0 lb

## 2021-01-27 DIAGNOSIS — J849 Interstitial pulmonary disease, unspecified: Secondary | ICD-10-CM

## 2021-01-27 DIAGNOSIS — R1310 Dysphagia, unspecified: Secondary | ICD-10-CM

## 2021-01-27 DIAGNOSIS — J479 Bronchiectasis, uncomplicated: Secondary | ICD-10-CM

## 2021-01-27 MED ORDER — STIOLTO RESPIMAT 2.5-2.5 MCG/ACT IN AERS: 2.0000 | INHALATION_SPRAY | Freq: Every day | RESPIRATORY_TRACT | 0 refills | Status: DC

## 2021-01-27 NOTE — Patient Instructions (Signed)
Continue stiolto inhaler 2 puffs daily  We will follow up the visit report from rheumatology from this week  We will wait to see if they want to start anti-inflammatory medications and we will consider starting anti-fibrotic therapy in the future as well.   We will refer you to GI for evaluation of trouble swallowing.

## 2021-01-27 NOTE — Progress Notes (Signed)
Synopsis: Referred in May 2022 for cough  Subjective:   PATIENT ID: Jocelyn Bond GENDER: female DOB: 1956/01/01, MRN: [id number]  HPI  Chief Complaint  Patient presents with   Follow-up    2 mo f/u. States the Stiolto has helped her breathing. She was able to get 1 inhaler from the pharmacy but has not been able to get anymore.    Jocelyn Bond is a 65 year old woman, never smoker with hypertension and obesity who is returns to pulmonary clinic for bronchiectasis and interstitial lung disease.   She was initially evaluated for chronic cough and noted to have bronchiectasis and fibrotic features on her CT chest imaging predominantly at the bases.   Serologic workup showed CK 199, ANA titer 1:1,280 with a cytoplasmic/reticular pattern, weak positive Anti-PL-12 ab, Anti-SS-A ab 47, Anti-U1 RNP ab 152, and Anti-U2 RNP ab moderate positive. Serologic workup is negative for hypersensitivity pneumonitis panel.  Pulmonary function tests 12/10/20 show mild restrictive defect and moderate diffusion defect.   Patient was started on stiolto ellipta at last visit and does report some improvement in the cough. She otherwise denies any progression in her shortness of breath or limitations to her daily activities. 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  OV 11/24/20 She reports having cough over the past year or so that can occur at any time.  She reports the cough does not wake her up from sleep though.  The cough is worse when exerting herself such as walking to her car in a parking lot.  She is coughing up white thick mucus.  She denies any issues with reflux.  She does report occasional choking episodes when eating meats.  She is a never smoker but reports her husband smoked around her for 13 years.  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  She denies any significant exposure to  any harmful chemicals or dust from her work.  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  Serologic work-up in 2009 was positive for ANA with a 1: 320 titer and negative ANCA, dsDNA, SSA/SSB, scl-70 antibodies. From record review it appears she was treated with a course of prednisone per phone notes.  She reports after being treated for this pneumonia she has not had issues with her breathing and has only reported a cough over the last year.  She denies any joint pains or skin rashes.  Her sister does have history of rheumatoid arthritis.  Past Medical History:  Diagnosis Date   Allergy    very rare    Hypertension    Obesity      Family History  Problem Relation Age of Onset   Hypertension Mother    Hypertension Father    Cancer Sister        breast ca   Arthritis/Rheumatoid Sister    Colon cancer Neg Hx    Colon polyps Neg Hx    Esophageal cancer Neg Hx    Rectal cancer Neg Hx    Stomach cancer Neg Hx      Social History   Socioeconomic History   Marital status: Single    Spouse name: Not on file   Number of children: Not on file   Years of education: Not on file   Highest education level: Not on  file  Occupational History   Not on file  Tobacco Use   Smoking status: Never   Smokeless tobacco: Never  Substance and Sexual Activity   Alcohol use: Yes    Alcohol/week: 1.0 standard drink    Types: 1 Glasses of wine per week    Comment: rare    Drug use: No   Sexual activity: Not Currently  Other Topics Concern   Not on file  Social History Narrative   Not on file   Social Determinants of Health   Financial Resource Strain: Not on file  Food Insecurity: Not on file  Transportation Needs: Not on file  Physical Activity: Not on file  Stress: Not on file  Social Connections: Not on file  Intimate Partner  Violence: Not on file     No Known Allergies   Outpatient Medications Prior to Visit  Medication Sig Dispense Refill   Ginkgo Biloba 40 MG TABS Take by mouth.     glucosamine-chondroitin 500-400 MG tablet Take 1 tablet by mouth 3 (three) times daily.     [place], Centella asiatica, (GOTU KOLA PO) Take 1 capsule by mouth.     hydrochlorothiazide (MICROZIDE) 12.5 MG capsule Take 1 capsule (12.5 mg total) by mouth daily. 90 capsule 0   losartan (COZAAR) 50 MG tablet Take 1 tablet (50 mg total) by mouth daily. 90 tablet 0   Magnesium 250 MG TABS Take by mouth.     Multiple Vitamins-Minerals (MULTIVITAMIN WITH MINERALS) tablet Take 1 tablet by mouth daily.     Tiotropium Bromide-Olodaterol (STIOLTO RESPIMAT) 2.5-2.5 MCG/ACT AERS Inhale 2 puffs into the lungs daily. 4 g 0   Tiotropium Bromide-Olodaterol (STIOLTO RESPIMAT) 2.5-2.5 MCG/ACT AERS Inhale 2 puffs into the lungs daily. 4 g 1   No facility-administered medications prior to visit.    Review of Systems  Constitutional:  Negative for chills, fever, malaise/fatigue and weight loss.  HENT:  Negative for congestion, sinus pain and sore throat.   Eyes: Negative.   Respiratory:  Positive for cough and sputum production. Negative for hemoptysis, shortness of breath and wheezing.   Cardiovascular:  Negative for chest pain, palpitations, orthopnea, claudication and leg swelling.  Gastrointestinal:  Negative for abdominal pain, heartburn, nausea and vomiting.  Genitourinary: Negative.   Musculoskeletal:  Negative for joint pain and myalgias.  Skin:  Negative for rash.  Neurological:  Negative for weakness.  Endo/Heme/Allergies: Negative.   Psychiatric/Behavioral: Negative.     Objective:   Vitals:   01/27/21 1613  BP: 132/86  Pulse: 83  SpO2: 98%  Weight: 221 lb (100.2 kg)  Height: '5\' 5"'$  (1.651 m)   Physical Exam Constitutional:      General: She is not in acute distress.    Appearance: She is not ill-appearing.  HENT:      Head: Normocephalic and atraumatic.     Nose: Nose normal.     Mouth/Throat:     Mouth: Mucous membranes are moist.     Pharynx: Oropharynx is clear.  Eyes:     General: No scleral icterus.    Conjunctiva/sclera: Conjunctivae normal.     Pupils: Pupils are equal, round, and reactive to light.  Cardiovascular:     Rate and Rhythm: Normal rate and regular rhythm.     Pulses: Normal pulses.     Heart sounds: Normal heart sounds. No murmur heard. Pulmonary:     Effort: Pulmonary effort is normal.     Breath sounds: Rales (bibasilar bases) present. No wheezing or  rhonchi.  Abdominal:     General: Bowel sounds are normal.     Palpations: Abdomen is soft.  Musculoskeletal:        General: No swelling or tenderness.     Right lower leg: No edema.     Left lower leg: No edema.  Lymphadenopathy:     Cervical: No cervical adenopathy.  Skin:    General: Skin is warm and dry.     Capillary Refill: Capillary refill takes less than 2 seconds.     Findings: No lesion or rash.  Neurological:     General: No focal deficit present.     Mental Status: She is alert.  Psychiatric:        Mood and Affect: Mood normal.        Behavior: Behavior normal.        Thought Content: Thought content normal.        Judgment: Judgment normal.    CBC    Component Value Date/Time   WBC 7.7 06/23/2020 1453   WBC 7.7 12/19/2015 0825   RBC 4.65 06/23/2020 1453   RBC 4.44 12/19/2015 0825   HGB 13.6 06/23/2020 1453   HCT 41.4 06/23/2020 1453   PLT 273 06/23/2020 1453   MCV 89 06/23/2020 1453   MCH 29.2 06/23/2020 1453   MCH 28.6 12/19/2015 0825   MCHC 32.9 06/23/2020 1453   MCHC 32.4 12/19/2015 0825   RDW 13.2 06/23/2020 1453   LYMPHSABS 2.3 06/23/2020 1453   MONOABS 1,078 (H) 12/19/2015 0825   EOSABS 0.3 06/23/2020 1453   BASOSABS 0.1 06/23/2020 1453   BMP Latest Ref Rng & Units 06/23/2020 06/20/2019 12/05/2018  Glucose 65 - 99 mg/dL 84 83 -  BUN 8 - 27 mg/dL 11 18 -  Creatinine 0.57 - 1.00  mg/dL 1.11(H) 0.99 0.90  BUN/Creat Ratio 12 - 28 10(L) 18 -  Sodium 134 - 144 mmol/L 141 139 -  Potassium 3.5 - 5.2 mmol/L 3.7 4.1 -  Chloride 96 - 106 mmol/L 101 99 -  CO2 20 - 29 mmol/L 25 26 -  Calcium 8.7 - 10.3 mg/dL 8.9 9.3 -   Chest imaging: CT Chest 10/23/20 Mediastinum/Nodes: Mediastinal and hilar lymph nodes are not enlarged by CT size criteria. No axillary adenopathy. Esophagus is grossly unremarkable.   Lungs/Pleura: Peripheral and basilar predominant subpleural reticulation, ground-glass and traction bronchiectasis/bronchiolectasis. Postoperative changes in the right hemithorax. No pleural fluid. Airway is unremarkable.  PFT: PFT Results Latest Ref Rng & Units 12/10/2020  FVC-Pre L 2.37  FVC-Predicted Pre % 89  FVC-Post L 2.41  FVC-Predicted Post % 90  Pre FEV1/FVC % % 89  Post FEV1/FCV % % 90  FEV1-Pre L 2.12  FEV1-Predicted Pre % 101  FEV1-Post L 2.17  DLCO uncorrected ml/min/mmHg 11.50  DLCO UNC% % 55  DLCO corrected ml/min/mmHg 11.50  DLCO COR %Predicted % 55  DLVA Predicted % 88  TLC L 3.76  TLC % Predicted % 72  RV % Predicted % 64  PFT - Mild restriction and moderate diffusion defect  Path: 2009:  1,2,3. LUNG, RIGHT LOWER LOBE AND RIGHT MIDDLE LOBE OPEN,   BIOPSIES:   - BRONCHIOLITIS OBLITERANS ORGANIZING PNEUMONIA (BOOP)    Assessment & Plan:   ILD (interstitial lung disease) ([place])  Bronchiectasis without complication ([place])  Dysphagia, unspecified type - Plan: Ambulatory referral to Gastroenterology  Discussion: Jocelyn Bond is a 65 year old woman, never smoker with hypertension, obesity and bronchiolitis obliterans organizing pneumonia in 2009 who returns  to pulmonary clinic for bronchiectasis and interstitial lung disease.   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 She has consult with Rheumatology on 01/29/21 for further evaluation of her condition. We will await further recommendations from rheumatology in regards to potential immunosuppressive therapy. Given her fibrotic features on chest imaging she is also a candidate for anti-fibrotic therapy which we will consider starting after her rheumatology evaluation.   She is to continue on stiolto inhaler as this has improved her cough symptoms. She is having issues with her insurance in obtain this inhaler at the pharmacy so we will provide her with a sample today.   She continues to complain of food getting stuck in her chest so we will refer her to GI for further evaluation, possibly another sign of systemic sclerosis.   Follow up in 1 month.  Freda Jackson, MD [facility] Office: [phone]    Current Outpatient Medications:    Ginkgo Biloba 40 MG TABS, Take by mouth., Disp: , Rfl:    glucosamine-chondroitin 500-400 MG tablet, Take 1 tablet by mouth 3 (three) times daily., Disp: , Rfl:    [place], Centella asiatica, (GOTU KOLA PO), Take 1 capsule by mouth., Disp: , Rfl:    hydrochlorothiazide (MICROZIDE) 12.5 MG capsule, Take 1 capsule (12.5 mg total) by mouth daily., Disp: 90 capsule, Rfl: 0   losartan (COZAAR) 50 MG tablet, Take 1 tablet (50 mg total) by mouth daily., Disp: 90 tablet, Rfl: 0   Magnesium 250 MG TABS, Take by mouth., Disp: , Rfl:    Multiple Vitamins-Minerals (MULTIVITAMIN WITH MINERALS) tablet, Take 1 tablet by mouth daily., Disp: , Rfl:    Tiotropium Bromide-Olodaterol (STIOLTO RESPIMAT) 2.5-2.5 MCG/ACT AERS, Inhale 2 puffs into the lungs daily., Disp: 4 g, Rfl: 0   Tiotropium Bromide-Olodaterol (STIOLTO RESPIMAT) 2.5-2.5 MCG/ACT AERS, Inhale 2 puffs into the lungs daily., Disp: 4 g, Rfl: 1   Tiotropium Bromide-Olodaterol (STIOLTO RESPIMAT) 2.5-2.5 MCG/ACT AERS, Inhale 2 puffs into the lungs daily., Disp: 8 g, Rfl:  0

## 2021-01-28 ENCOUNTER — Encounter: Payer: Self-pay | Admitting: Pulmonary Disease

## 2021-01-29 ENCOUNTER — Other Ambulatory Visit: Payer: Self-pay

## 2021-01-29 ENCOUNTER — Encounter: Payer: Self-pay | Admitting: Internal Medicine

## 2021-01-29 ENCOUNTER — Ambulatory Visit (INDEPENDENT_AMBULATORY_CARE_PROVIDER_SITE_OTHER): Payer: BC Managed Care – PPO | Admitting: Internal Medicine

## 2021-01-29 VITALS — BP 144/90 | HR 84 | Ht 65.0 in | Wt 227.0 lb

## 2021-01-29 DIAGNOSIS — R21 Rash and other nonspecific skin eruption: Secondary | ICD-10-CM | POA: Diagnosis not present

## 2021-01-29 DIAGNOSIS — J849 Interstitial pulmonary disease, unspecified: Secondary | ICD-10-CM | POA: Diagnosis not present

## 2021-01-29 DIAGNOSIS — Z79899 Other long term (current) drug therapy: Secondary | ICD-10-CM | POA: Diagnosis not present

## 2021-01-29 DIAGNOSIS — R768 Other specified abnormal immunological findings in serum: Secondary | ICD-10-CM | POA: Insufficient documentation

## 2021-01-29 NOTE — Progress Notes (Signed)
Office Visit Note  Patient: Jocelyn Bond             Date of Birth: Aug 22, 1955           MRN: [id number]             PCP: Denita Lung, MD Referring: Freddi Starr, MD Visit Date: 01/29/2021 Occupation: [employer] worker  Subjective:   History of Present Illness: Jocelyn Bond is a 65 y.o. female here for evaluation for systemic connective tissue disease with positive antibodies and diagnosis of ILD.  She does seem to have a degree of chronic nonproductive cough but denies significant shortness of breath with exertion or at rest.  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  Current coughing has been noticeable since about 1 year ago.  She denies any particular complaints with joint pains or swelling.  She experiences some chronic rash on her distal arms and hands bilaterally that is controlled with topical treatment.  She experiences purple discoloration in the tips of the third finger of each hand with cold exposure that improved after rewarming.  She reports occasional choking on solid foods and having to regurgitate but this is infrequent.  She denies symptomatic acid reflux.  She experiences swelling of the right foot chronically states previous studies have indicated venous insufficiency around the level of the ankle without a particularly identified cause.  She denies any history of blood clots.  She also denies alopecia, oral ulcers, or photosensitive skin rashes.  Labs reviewed 11/2020 ANA 1:1280 cytoplasmic/reticular 1:1280 speckled Scl-70, SSA, SSb neg RF 40 CCP neg MyoMarker 3 Pl-12 weak pos, Ku weak pos, SSA 52kD 47, U1RNP 152, U2RNP moderate pos ANCA neg CK 199   Activities of Daily Living:  Patient reports morning stiffness for 0 minutes.   Patient Denies nocturnal pain.  Difficulty dressing/grooming:  Denies Difficulty climbing stairs: Denies Difficulty getting out of chair: Denies Difficulty using hands for taps, buttons, cutlery, and/or writing: Denies  Review of Systems  Constitutional:  Negative for fatigue.  HENT:  Negative for mouth sores, mouth dryness and nose dryness.   Eyes:  Negative for pain, itching and dryness.  Respiratory:  Negative for shortness of breath and difficulty breathing.   Cardiovascular:  Negative for chest pain and palpitations.  Gastrointestinal:  Negative for blood in stool, constipation and diarrhea.  Endocrine: Negative for increased urination.  Genitourinary:  Negative for difficulty urinating.  Musculoskeletal:  Positive for joint swelling. Negative for joint pain, joint pain, myalgias, morning stiffness, muscle tenderness and myalgias.  Skin:  Positive for color change and rash. Negative for redness.  Allergic/Immunologic: Negative for susceptible to infections.  Neurological:  Positive for numbness. Negative for dizziness, headaches, memory loss and weakness.  Hematological:  Negative for bruising/bleeding tendency.  Psychiatric/Behavioral:  Negative for confusion.    PMFS History:  Patient Active Problem List   Diagnosis Date Noted   Positive ANA (antinuclear antibody) 01/29/2021   ILD (interstitial lung disease) ([place]) 01/29/2021   High risk medication use 01/29/2021   Seborrheic keratosis 06/20/2019   Lymphadenopathy of head and neck 02/16/2016   Chronic venous insufficiency 12/11/2012   Obesity (BMI 30-39.9) 12/11/2012   Essential hypertension 01/09/2008    Past Medical History:  Diagnosis Date   Allergy    very rare    Hypertension    Obesity     Family History  Problem  Relation Age of Onset   Hypertension Mother    Hypertension Father    Hypertension Sister    Cancer Sister        breast ca   Hypertension Sister    Arthritis/Rheumatoid Sister    Hypertension Sister    Fibromyalgia Sister    Hypertension Sister    Healthy  Son    Healthy Daughter    Healthy Daughter    Colon cancer Neg Hx    Colon polyps Neg Hx    Esophageal cancer Neg Hx    Rectal cancer Neg Hx    Stomach cancer Neg Hx    Past Surgical History:  Procedure Laterality Date   BTL     BUNIONECTOMY     COLONOSCOPY  2009   13 yrs ago [facility]- pt states removed polyps , no report, no path    LUNG BIOPSY  2009   TUBAL LIGATION     Social History   Social History Narrative   Not on file   Immunization History  Administered Date(s) Administered   Influenza,inj,Quad PF,6+ Mos 04/10/2018, 06/12/2020   Influenza-Unspecified 05/05/2016   Moderna SARS-COV2 Booster Vaccination 06/09/2020   Moderna Sars-Covid-2 Vaccination 09/13/2019, 10/16/2019   Tdap 08/06/2010   Zoster Recombinat (Shingrix) 04/10/2018, 07/11/2018     Objective: Vital Signs: BP (!) 144/90 (BP Location: Right Arm, Patient Position: Sitting, Cuff Size: Large)   Pulse 84   Ht '5\' 5"'$  (1.651 m)   Wt 227 lb (103 kg)   BMI 37.77 kg/m    Physical Exam Constitutional:      Appearance: She is obese.  HENT:     Mouth/Throat:     Mouth: Mucous membranes are moist.     Pharynx: Oropharynx is clear.  Eyes:     Conjunctiva/sclera: Conjunctivae normal.  Cardiovascular:     Rate and Rhythm: Normal rate and regular rhythm.  Pulmonary:     Effort: Pulmonary effort is normal.     Comments: Inspiratory crackles Skin:    General: Skin is warm and dry.     Comments: Hyperpigmentation and slightly raised papules on forearm and hand extensor surfaces  Neurological:     General: No focal deficit present.     Mental Status: She is alert.  Psychiatric:        Mood and Affect: Mood normal.     Musculoskeletal Exam:  Neck full ROM no tenderness Shoulders full ROM no tenderness or swelling Elbows full ROM no tenderness or swelling Wrists full ROM no tenderness or swelling Fingers full ROM no tenderness or swelling Knees full ROM no tenderness or swelling Ankles full ROM no  tenderness or swelling   Investigation: No additional findings.  Imaging: No results found.  Recent Labs: Lab Results  Component Value Date   WBC 7.7 06/23/2020   HGB 13.6 06/23/2020   PLT 273 06/23/2020   NA 141 06/23/2020   K 3.7 06/23/2020   CL 101 06/23/2020   CO2 25 06/23/2020   GLUCOSE 84 06/23/2020   BUN 11 06/23/2020   CREATININE 1.11 (H) 06/23/2020   BILITOT 0.4 06/23/2020   ALKPHOS 79 06/23/2020   AST 24 06/23/2020   ALT 17 06/23/2020   PROT 7.1 06/23/2020   ALBUMIN 3.6 (L) 06/23/2020   CALCIUM 8.9 06/23/2020   GFRAA 61 06/23/2020   QFTBGOLDPLUS NEGATIVE 01/29/2021    Speciality Comments: No specialty comments available.  Procedures:  No procedures performed Allergies: Patient has no known allergies.   Assessment / Plan:  Visit Diagnoses: Positive ANA (antinuclear antibody) - Plan: CK, Aldolase ILD (interstitial lung disease) ([place]) - Plan: CK, Aldolase  Positive ANA and biomarker and rheumatoid factor with pulmonary symptoms apparently an NSIP pattern based on CT report.  She does have a few symptoms with atypical Raynaud's involving the third digit of bilateral hands, hyperpigmentation and papular rash on forearm extensors and hands.  Swallowing symptoms could be mild oropharyngeal stage dysphagia but could have silent ongoing reflux. Checking CK and aldolase today with mild CK elevation seen previously. Does not meet clinical criteria of specific disorder at this time but suspicious for overlap syndrome. May benefit with further evaluation possible echocardiography and endoscopy.  High risk medication use - Plan: QuantiFERON-TB Gold Plus, IgG, IgA, IgM  Presentation is somewhat suspicious for ongoing systemic connective tissue disease daily future DMARD treatment checking QuantiFERON and immunoglobulins today.  Reviewed chart with previous negative hepatitis serology.  Orders: Orders Placed This Encounter  Procedures   CK   Aldolase    QuantiFERON-TB Gold Plus   IgG, IgA, IgM    No orders of the defined types were placed in this encounter.    Follow-Up Instructions: Return in about 4 weeks (around 02/26/2021) for New pt f/u.   Collier Salina, MD  Note - This record has been created using [employer].  Chart creation errors have been sought, but may not always  have been located. Such creation errors do not reflect on  the standard of medical care.

## 2021-02-01 LAB — ALDOLASE: Aldolase: 5.3 U/L (ref <=8.1)

## 2021-02-01 LAB — CK: Total CK: 239 U/L — ABNORMAL HIGH (ref 29–143)

## 2021-02-01 LAB — QUANTIFERON-TB GOLD PLUS
Mitogen-NIL: >10 IU/mL
NIL: 0.18 IU/mL
QuantiFERON-TB Gold Plus: NEGATIVE
TB1-NIL: <0 IU/mL
TB2-NIL: <0 IU/mL

## 2021-02-01 LAB — IGG, IGA, IGM
IgG (Immunoglobin G), Serum: 2050 mg/dL — ABNORMAL HIGH (ref 600–1540)
IgM, Serum: 99 mg/dL (ref 50–300)
Immunoglobulin A: 594 mg/dL — ABNORMAL HIGH (ref 70–320)

## 2021-02-03 DIAGNOSIS — R21 Rash and other nonspecific skin eruption: Secondary | ICD-10-CM | POA: Insufficient documentation

## 2021-02-03 NOTE — Progress Notes (Signed)
Yes will go ahead and refer to derm suspicious for dermatomyositis or mixed connective tissue disease rash. I will also refer to cardiology for echocardiogram looking for pulmonary hypertension, which we mentioned in clinic and she needs based on reviewing her chest CT.

## 2021-02-03 NOTE — Addendum Note (Signed)
Addended by: Collier Salina on: 02/03/2021 03:16 PM   Modules accepted: Orders

## 2021-02-03 NOTE — Progress Notes (Signed)
Left VM x1 request call back, lab results show some increase in numbers from previous visit overall looks suggestive for some inflammation affecting muscles and skin and possibly not just the lungs. I will message Dr. Erin Fulling about this and coordinating any additional work up.  Has she seen a dermatologist with the skin rashes and bumps or ever had a biopsy of this?

## 2021-02-07 ENCOUNTER — Other Ambulatory Visit: Payer: Self-pay | Admitting: Family Medicine

## 2021-02-07 DIAGNOSIS — I10 Essential (primary) hypertension: Secondary | ICD-10-CM

## 2021-02-20 LAB — HM MAMMOGRAPHY

## 2021-02-24 ENCOUNTER — Other Ambulatory Visit: Payer: Self-pay

## 2021-02-24 ENCOUNTER — Ambulatory Visit (INDEPENDENT_AMBULATORY_CARE_PROVIDER_SITE_OTHER): Payer: BC Managed Care – PPO | Admitting: Pulmonary Disease

## 2021-02-24 ENCOUNTER — Encounter: Payer: Self-pay | Admitting: Pulmonary Disease

## 2021-02-24 VITALS — BP 128/84 | HR 84 | Ht 65.0 in | Wt 228.0 lb

## 2021-02-24 DIAGNOSIS — J479 Bronchiectasis, uncomplicated: Secondary | ICD-10-CM | POA: Diagnosis not present

## 2021-02-24 DIAGNOSIS — J849 Interstitial pulmonary disease, unspecified: Secondary | ICD-10-CM | POA: Diagnosis not present

## 2021-02-24 NOTE — Patient Instructions (Addendum)
We will schedule you an appointment with our pharmacy team to discuss anti-fibrotic therapy.  We will check an ultrasound of your heart.  We will see you after your visit with the dermatologist

## 2021-02-24 NOTE — Progress Notes (Signed)
Synopsis: Referred in May 2022 for cough  Subjective:   PATIENT ID: Jocelyn Bond GENDER: female DOB: 10-03-1955, MRN: [id number]  HPI  Chief Complaint  Patient presents with   Follow-up    4wk f/u. Was able to establish with Dr. Benjamine Mola at [facility]. States she has been doing well since last visit.    Jocelyn Bond is a 65 year old woman, never smoker with hypertension and obesity who returns to pulmonary clinic for bronchiectasis and interstitial lung disease.   She was initially evaluated for chronic cough and noted to have bronchiectasis and fibrotic features on her CT chest imaging predominantly at the bases.   Serologic workup showed CK 199, ANA titer 1:1,280 with a cytoplasmic/reticular pattern, weak positive Anti-PL-12 ab, Anti-SS-A ab 47, Anti-U1 RNP ab 152, and Anti-U2 RNP ab moderate positive. Serologic workup is negative for hypersensitivity pneumonitis panel. CK level is elevated at 239 on 01/29/21 and 199 on 11/28/20.   Pulmonary function tests 12/10/20 show mild restrictive defect and moderate diffusion defect.   She was evaluated by Dr. Benjamine Mola of rheumatology on 01/29/21 for concern of systemic sclerosis or polymyositis. 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 No specific anti-inflammatory treatment was recommended at this time.   She continues on stiolto with good improvement in her breathing.  Past Medical History:  Diagnosis Date   Allergy    very rare    Hypertension    Obesity      Family History  Problem Relation Age of Onset   Hypertension Mother    Hypertension Father    Hypertension Sister    Cancer Sister        breast ca   Hypertension Sister    Arthritis/Rheumatoid Sister    Hypertension  Sister    Fibromyalgia Sister    Hypertension Sister    Healthy Son    Healthy Daughter    Healthy Daughter    Colon cancer Neg Hx    Colon polyps Neg Hx    Esophageal cancer Neg Hx    Rectal cancer Neg Hx    Stomach cancer Neg Hx      Social History   Socioeconomic History   Marital status: Single    Spouse name: Not on file   Number of children: Not on file   Years of education: Not on file   Highest education level: Not on file  Occupational History   Not on file  Tobacco Use   Smoking status: Never   Smokeless tobacco: Never  Vaping Use   Vaping Use: Never used  Substance and Sexual Activity   Alcohol use: Yes    Alcohol/week: 1.0 standard drink    Types: 1 Glasses of wine per week    Comment: rare    Drug use: No   Sexual activity: Not Currently  Other Topics Concern   Not on file  Social History Narrative   Not on file   Social Determinants of Health   Financial Resource Strain: Not on file  Food Insecurity: Not on file  Transportation Needs: Not on file  Physical Activity: Not on file  Stress: Not on file  Social Connections: Not on file  Intimate Partner Violence: Not on file     No Known Allergies   Outpatient Medications Prior to  Visit  Medication Sig Dispense Refill   Ginkgo Biloba 40 MG TABS Take by mouth.     glucosamine-chondroitin 500-400 MG tablet Take 1 tablet by mouth 3 (three) times daily.     [place], Centella asiatica, (GOTU KOLA PO) Take 1 capsule by mouth.     hydrochlorothiazide (MICROZIDE) 12.5 MG capsule TAKE 1 CAPSULE BY MOUTH EVERY DAY 90 capsule 0   losartan (COZAAR) 50 MG tablet TAKE 1 TABLET BY MOUTH EVERY DAY 90 tablet 0   Magnesium 250 MG TABS Take by mouth.     Multiple Vitamins-Minerals (MULTIVITAMIN WITH MINERALS) tablet Take 1 tablet by mouth daily.     Tiotropium Bromide-Olodaterol (STIOLTO RESPIMAT) 2.5-2.5 MCG/ACT AERS Inhale 2 puffs into the lungs daily. 8 g 0   Tiotropium Bromide-Olodaterol (STIOLTO RESPIMAT)  2.5-2.5 MCG/ACT AERS Inhale 2 puffs into the lungs daily. (Patient not taking: Reported on 01/29/2021) 4 g 0   Tiotropium Bromide-Olodaterol (STIOLTO RESPIMAT) 2.5-2.5 MCG/ACT AERS Inhale 2 puffs into the lungs daily. (Patient not taking: Reported on 01/29/2021) 4 g 1   No facility-administered medications prior to visit.   Review of Systems  Constitutional:  Negative for chills, fever, malaise/fatigue and weight loss.  HENT:  Negative for congestion, sinus pain and sore throat.   Eyes: Negative.   Respiratory:  Positive for cough and sputum production. Negative for hemoptysis, shortness of breath and wheezing.   Cardiovascular:  Negative for chest pain, palpitations, orthopnea, claudication and leg swelling.  Gastrointestinal:  Negative for abdominal pain, heartburn, nausea and vomiting.  Genitourinary: Negative.   Musculoskeletal:  Negative for joint pain and myalgias.  Skin:  Negative for rash.  Neurological:  Negative for weakness.  Endo/Heme/Allergies: Negative.   Psychiatric/Behavioral: Negative.     Objective:   Vitals:   02/24/21 1630  BP: 128/84  Pulse: 84  SpO2: 98%  Weight: 103.4 kg  Height: _0  (1.651 m)   Physical Exam Constitutional:      General: She is not in acute distress.    Appearance: She is not ill-appearing.  HENT:     Head: Normocephalic and atraumatic.     Nose: Nose normal.     Mouth/Throat:     Mouth: Mucous membranes are moist.     Pharynx: Oropharynx is clear.  Eyes:     General: No scleral icterus.    Conjunctiva/sclera: Conjunctivae normal.     Pupils: Pupils are equal, round, and reactive to light.  Cardiovascular:     Rate and Rhythm: Normal rate and regular rhythm.     Pulses: Normal pulses.     Heart sounds: Normal heart sounds. No murmur heard. Pulmonary:     Effort: Pulmonary effort is normal.     Breath sounds: Rales (bibasilar bases) present. No wheezing or rhonchi.  Abdominal:     General: Bowel sounds are normal.      Palpations: Abdomen is soft.  Musculoskeletal:        General: No swelling or tenderness.     Right lower leg: No edema.     Left lower leg: No edema.  Lymphadenopathy:     Cervical: No cervical adenopathy.  Skin:    General: Skin is warm and dry.     Capillary Refill: Capillary refill takes less than 2 seconds.     Findings: No lesion or rash.  Neurological:     General: No focal deficit present.     Mental Status: She is alert.    CBC    Component  Value Date/Time   WBC 7.7 06/23/2020 1453   WBC 7.7 12/19/2015 0825   RBC 4.65 06/23/2020 1453   RBC 4.44 12/19/2015 0825   HGB 13.6 06/23/2020 1453   HCT 41.4 06/23/2020 1453   PLT 273 06/23/2020 1453   MCV 89 06/23/2020 1453   MCH 29.2 06/23/2020 1453   MCH 28.6 12/19/2015 0825   MCHC 32.9 06/23/2020 1453   MCHC 32.4 12/19/2015 0825   RDW 13.2 06/23/2020 1453   LYMPHSABS 2.3 06/23/2020 1453   MONOABS 1,078 (H) 12/19/2015 0825   EOSABS 0.3 06/23/2020 1453   BASOSABS 0.1 06/23/2020 1453   BMP Latest Ref Rng & Units 06/23/2020 06/20/2019 12/05/2018  Glucose 65 - 99 mg/dL 84 83 -  BUN 8 - 27 mg/dL 11 18 -  Creatinine 0.57 - 1.00 mg/dL 1.11(H) 0.99 0.90  BUN/Creat Ratio 12 - 28 10(L) 18 -  Sodium 134 - 144 mmol/L 141 139 -  Potassium 3.5 - 5.2 mmol/L 3.7 4.1 -  Chloride 96 - 106 mmol/L 101 99 -  CO2 20 - 29 mmol/L 25 26 -  Calcium 8.7 - 10.3 mg/dL 8.9 9.3 -   Chest imaging: CT Chest 10/23/20 Mediastinum/Nodes: Mediastinal and hilar lymph nodes are not enlarged by CT size criteria. No axillary adenopathy. Esophagus is grossly unremarkable.   Lungs/Pleura: Peripheral and basilar predominant subpleural reticulation, ground-glass and traction bronchiectasis/bronchiolectasis. Postoperative changes in the right hemithorax. No pleural fluid. Airway is unremarkable.  PFT: PFT Results Latest Ref Rng & Units 12/10/2020  FVC-Pre L 2.37  FVC-Predicted Pre % 89  FVC-Post L 2.41  FVC-Predicted Post % 90  Pre FEV1/FVC % % 89   Post FEV1/FCV % % 90  FEV1-Pre L 2.12  FEV1-Predicted Pre % 101  FEV1-Post L 2.17  DLCO uncorrected ml/min/mmHg 11.50  DLCO UNC% % 55  DLCO corrected ml/min/mmHg 11.50  DLCO COR %Predicted % 55  DLVA Predicted % 88  TLC L 3.76  TLC % Predicted % 72  RV % Predicted % 64  PFT - Mild restriction and moderate diffusion defect  Path: 2009:  1,2,3. LUNG, RIGHT LOWER LOBE AND RIGHT MIDDLE LOBE OPEN,   BIOPSIES:   - BRONCHIOLITIS OBLITERANS ORGANIZING PNEUMONIA ([place])    Assessment & Plan:   ILD (interstitial lung disease) ([place]) - Plan: ECHOCARDIOGRAM COMPLETE  Bronchiectasis without complication ([place])  Discussion: Jocelyn Bond is a 65 year old woman, never smoker with hypertension, obesity and bronchiolitis obliterans organizing pneumonia in 2009 who returns to pulmonary clinic for bronchiectasis and interstitial lung disease.   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 She was seen by Rheumatology on 01/29/21 with no recommendations for systemic therapy at this time. We will await further evaluation/recommendations from dermatology as they evaluate her for dermatomyositis.   Given her fibrotic features on chest imaging she is a candidate for anti-fibrotic therapy. She is interested in meeting with our pharmacy team for more information regarding this therapy.  She is to continue on stiolto inhaler as this has improved her cough symptoms.   Follow up in 3 months  Freda Jackson, MD [facility] Office: [phone]    Current Outpatient Medications:    Ginkgo Biloba 40 MG TABS, Take by mouth., Disp: , Rfl:    glucosamine-chondroitin 500-400 MG tablet, Take 1 tablet by mouth 3 (three) times daily., Disp: ,  Rfl:    [place], Centella asiatica,  (GOTU KOLA PO), Take 1 capsule by mouth., Disp: , Rfl:    hydrochlorothiazide (MICROZIDE) 12.5 MG capsule, TAKE 1 CAPSULE BY MOUTH EVERY DAY, Disp: 90 capsule, Rfl: 0   losartan (COZAAR) 50 MG tablet, TAKE 1 TABLET BY MOUTH EVERY DAY, Disp: 90 tablet, Rfl: 0   Magnesium 250 MG TABS, Take by mouth., Disp: , Rfl:    Multiple Vitamins-Minerals (MULTIVITAMIN WITH MINERALS) tablet, Take 1 tablet by mouth daily., Disp: , Rfl:    Tiotropium Bromide-Olodaterol (STIOLTO RESPIMAT) 2.5-2.5 MCG/ACT AERS, Inhale 2 puffs into the lungs daily., Disp: 8 g, Rfl: 0

## 2021-02-26 ENCOUNTER — Ambulatory Visit: Payer: BC Managed Care – PPO | Admitting: Pharmacist

## 2021-02-26 NOTE — Progress Notes (Signed)
Subjective:  Patient called today by [facility] Pulmonary pharmacy team to review Esbriet vs Ofev for ILD.   Patient was last seen and referred by Dr. Erin Fulling on 02/24/21. Pertinent past medical history includes ILD, history of positive ANA. Per rheumatology workup, patient does not meet criteria for clinical diagnosis of specific autoimmune or rheum condition. She is hesitant to start any medication that has significant side effects when her disease has been well-controlled for many years.   History of CAD: No History of MI: No Current anticoagulant use: No - history of positive ANA History of HTN: Yes  History of elevated LFTs: No History of diarrhea, nausea, vomiting: No  Objective: No Known Allergies  Outpatient Encounter Medications as of 02/26/2021  Medication Sig   Ginkgo Biloba 40 MG TABS Take by mouth.   glucosamine-chondroitin 500-400 MG tablet Take 1 tablet by mouth 3 (three) times daily.   Gotu [place], Centella asiatica, (GOTU KOLA PO) Take 1 capsule by mouth.   hydrochlorothiazide (MICROZIDE) 12.5 MG capsule TAKE 1 CAPSULE BY MOUTH EVERY DAY   losartan (COZAAR) 50 MG tablet TAKE 1 TABLET BY MOUTH EVERY DAY   Magnesium 250 MG TABS Take by mouth.   Multiple Vitamins-Minerals (MULTIVITAMIN WITH MINERALS) tablet Take 1 tablet by mouth daily.   Tiotropium Bromide-Olodaterol (STIOLTO RESPIMAT) 2.5-2.5 MCG/ACT AERS Inhale 2 puffs into the lungs daily.   No facility-administered encounter medications on file as of 02/26/2021.     Immunization History  Administered Date(s) Administered   Influenza,inj,Quad PF,6+ Mos 04/10/2018, 06/12/2020   Influenza-Unspecified 05/05/2016   Moderna SARS-COV2 Booster Vaccination 06/09/2020   Moderna Sars-Covid-2 Vaccination 09/13/2019, 10/16/2019   Tdap 08/06/2010   Zoster Recombinat (Shingrix) 04/10/2018, 07/11/2018      PFT's TLC  Date Value Ref Range Status  12/10/2020 3.76 L Final      CMP     Component Value Date/Time   NA 141  06/23/2020 1453   K 3.7 06/23/2020 1453   CL 101 06/23/2020 1453   CO2 25 06/23/2020 1453   GLUCOSE 84 06/23/2020 1453   GLUCOSE 90 12/19/2015 0825   BUN 11 06/23/2020 1453   CREATININE 1.11 (H) 06/23/2020 1453   CREATININE 0.97 12/19/2015 0825   CALCIUM 8.9 06/23/2020 1453   PROT 7.1 06/23/2020 1453   ALBUMIN 3.6 (L) 06/23/2020 1453   AST 24 06/23/2020 1453   ALT 17 06/23/2020 1453   ALKPHOS 79 06/23/2020 1453   BILITOT 0.4 06/23/2020 1453   GFRNONAA 53 (L) 06/23/2020 1453   GFRAA 61 06/23/2020 1453      CBC    Component Value Date/Time   WBC 7.7 06/23/2020 1453   WBC 7.7 12/19/2015 0825   RBC 4.65 06/23/2020 1453   RBC 4.44 12/19/2015 0825   HGB 13.6 06/23/2020 1453   HCT 41.4 06/23/2020 1453   PLT 273 06/23/2020 1453   MCV 89 06/23/2020 1453   MCH 29.2 06/23/2020 1453   MCH 28.6 12/19/2015 0825   MCHC 32.9 06/23/2020 1453   MCHC 32.4 12/19/2015 0825   RDW 13.2 06/23/2020 1453   LYMPHSABS 2.3 06/23/2020 1453   MONOABS 1,078 (H) 12/19/2015 0825   EOSABS 0.3 06/23/2020 1453   BASOSABS 0.1 06/23/2020 1453      LFT's Hepatic Function Latest Ref Rng & Units 06/23/2020 06/20/2019 12/19/2015  Total Protein 6.0 - 8.5 g/dL 7.1 7.0 7.0  Albumin 3.8 - 4.8 g/dL 3.6(L) 3.7(L) 3.4(L)  AST 0 - 40 IU/L 24 21 22   ALT 0 - 32 IU/L  17 14 16   Alk Phosphatase 44 - 121 IU/L 79 95 76  Total Bilirubin 0.0 - 1.2 mg/dL 0.4 0.4 0.4    CT Chest w Contrast (10/23/20): Pulmonary parenchymal pattern of interstitial lung disease may be due to postinfectious/postinflammatory fibrosis, when compared with 12/16/2007. Other causes of interstitial lung disease, such as usual interstitial pneumonitis, nonspecific interstitial pneumonitis or chronic hypersensitivity pneumonitis.  Assessment  We reviewed Ofev vs Esbriet side effect comparison and risks in detail Ofev Medication Management Thoroughly counseled patient on the efficacy, mechanism of action, dosing, administration, adverse effects, and  monitoring parameters of Ofev. Patient verbalized understanding. Patient education handout provided.   Goals of Therapy: Will not stop or reverse the progression of ILD. It will slow the progression of ILD.  Inhibits tyrosine kinase inhibitors which slow the fibrosis/progression of ILD -Significant reduction in the rate of disease progression was observed after treatment (61.1% [before] vs 33.3% [after], P?=?0.008) over 42 weeks.  Dosing: 150 mg (one capsule) by mouth twice daily (approx 12 hours apart). Discussed taking with food approximately 12 hours apart. Discussed that capsule should not be crushed or split.  Adverse Effects: Nausea, vomiting, diarrhea (2 in 3 patients) appetite loss, weight loss - management of diarrhea with loperamide discussed including max use of 48 hours and max of 8 capsules per day. Abdominal pain (up to 1 in 5 patients) Nasopharyngitis (13%), UTI (6%) Risk of thrombosis (3%) and acute MI (2%) Hypertension (5%) Dizziness Fatigue (10%)  Monitoring: Monitor for diarrhea, nausea and vomiting, GI perforation, hepatotoxicity  Monitor LFTs - baseline, monthly for first 6 months, then every 3 months routinely CBC w differential at baseline and every 3 months routinely  Esbriet Medication Management Thoroughly counseled patient on the efficacy, mechanism of action, dosing, administration, adverse effects, and monitoring parameters of Esbriet.  Patient verbalized understanding. Patient education handout provided.   Goals of Therapy: Will not stop or reverse the progression of ILD. It will slow the progression of ILD.   Dosing: Starting dose will be Esbriet 267 mg 1 tablet three times daily for 7 days, then 2 tablets three times daily for 7 days, then 3 tablets three times daily.  Maintenance dose will be 801 mg 1 tablet three times daily if tolerated.  Stressed the importance of taking with meals and space at least 5-6 hours apart to minimize stomach upset.    Adverse Effects: Nausea, vomiting, diarrhea, weight loss Abdominal pain GERD Sun sensitivity/rash - patient advised to wear sunscreen when exposed to sunlight Dizziness Fatigue  Monitoring: Monitor for diarrhea, nausea and vomiting, GI perforation, hepatotoxicity  Monitor LFTs - baseline, monthly for first 6 months, then every 3 months routinely CBC w differential at baseline and every 3 months routinely  Medication Reconciliation A drug regimen assessment was performed, including review of allergies, interactions, disease-state management, dosing and immunization history. Medications were reviewed with the patient, including name, instructions, indication, goals of therapy, potential side effects, importance of adherence, and safe use.  Anticoagulant use: No  Patient does take Ginkgo which can increase risk for bleeding. Some caution is warranted with use of Ofev concomitantly.  Immunizations Patient is indicated for the influenzae, pneumonia, and shingles vaccinations. Patient has received 3 COVID19 vaccines.  PLAN: - After reviewing Esbriet and Ofev similarities and differences in detail, patient remains unsure about starting medication. I've offered to mail her medication information to review and if she decides to move forward with Esbriet or Ofev, she will reach out to the clinic. -  Will hold on starting benefits investigation for antifibrotic until patient gives permission to do so - I discussed this Dr. Erin Fulling in person on 02/27/21 and he is aware of her hesitancy to initiate antifibrotic.  This appointment required 60 minutes of patient care (this includes precharting, chart review, review of results, face-to-face care, etc.).  Thank you for involving pharmacy to assist in providing this patient's care.

## 2021-02-27 NOTE — Patient Instructions (Addendum)
Esbriet and Ofev information mailed to your home for review. Please call me at [phone] when you make a decision. Both Ofev and Esbriet require liver enzyme monitoring monthly for the first 6 months then every 3 months. Both require close follow-up in the clinic Both have same goal of treatment:  slow down lung scarring  Ofev ONLY: - dosed twice daily - greater rate of diarrhea but can also cause nausea - increased risk of bleeding (which can interact with your ginkgo supplement) - certain risk for heart events (heart attack). Higher risk if you have history of heart events which you don't  Esbriet ONLY: - dosed three times daily - greater rate of nausea vomiting but can also cause diarrhea - have to wear sunscreen due to risk of sun sensitivity and rash  Thanks!  Knox Saliva, PharmD, MPH, BCPS Clinical Pharmacist (Rheumatology and Pulmonology)

## 2021-03-06 ENCOUNTER — Encounter: Payer: Self-pay | Admitting: Family Medicine

## 2021-03-12 ENCOUNTER — Other Ambulatory Visit: Payer: Self-pay

## 2021-03-12 ENCOUNTER — Ambulatory Visit (HOSPITAL_COMMUNITY)
Admission: RE | Admit: 2021-03-12 | Discharge: 2021-03-12 | Disposition: A | Discharge status: Home / Self Care | Payer: BC Managed Care – PPO | Source: Ambulatory Visit | Attending: Pulmonary Disease | Admitting: Pulmonary Disease

## 2021-03-12 DIAGNOSIS — R0602 Shortness of breath: Secondary | ICD-10-CM | POA: Insufficient documentation

## 2021-03-12 DIAGNOSIS — J849 Interstitial pulmonary disease, unspecified: Secondary | ICD-10-CM | POA: Diagnosis not present

## 2021-03-12 DIAGNOSIS — I10 Essential (primary) hypertension: Secondary | ICD-10-CM | POA: Diagnosis not present

## 2021-03-12 DIAGNOSIS — E669 Obesity, unspecified: Secondary | ICD-10-CM | POA: Insufficient documentation

## 2021-03-12 LAB — ECHOCARDIOGRAM COMPLETE
Area-P 1/2: 4.93 cm2
S' Lateral: 2.9 cm

## 2021-03-12 NOTE — Progress Notes (Signed)
  Echocardiogram 2D Echocardiogram has been performed.  Jocelyn Bond 03/12/2021, 3:37 PM

## 2021-04-13 ENCOUNTER — Ambulatory Visit (INDEPENDENT_AMBULATORY_CARE_PROVIDER_SITE_OTHER): Payer: BC Managed Care – PPO | Admitting: Family Medicine

## 2021-04-13 ENCOUNTER — Other Ambulatory Visit: Payer: Self-pay

## 2021-04-13 VITALS — BP 110/76 | HR 75 | Temp 98.0°F | Wt 228.2 lb

## 2021-04-13 DIAGNOSIS — Z23 Encounter for immunization: Secondary | ICD-10-CM

## 2021-04-13 DIAGNOSIS — H9190 Unspecified hearing loss, unspecified ear: Secondary | ICD-10-CM | POA: Diagnosis not present

## 2021-04-13 NOTE — Progress Notes (Addendum)
   Subjective:    Patient ID: York Spaniel, female    DOB: 1955-12-11, 65 y.o.   MRN: [id number]  HPI She is here for to get a referral for audiology evaluation.  She states that she has a previous history of hearing loss and did use hearing aids but has not worn them in several years.  She is now being told by other people that she cannot hear as well as she should.  She also need update on her immunizations.   Review of Systems     Objective:   Physical Exam  Alert and in no distress.  Audiology evaluation is in the record. TMs and canals are normal.     Assessment & Plan:  High frequency hearing loss, unspecified laterality - Plan: Ambulatory referral to Audiology  Need for Tdap vaccination - Plan: Tdap vaccine greater than or equal to 7yo IM  Need for influenza vaccination - Plan: Flu Vaccine QUAD 6+ mos PF IM (Fluarix Quad PF)

## 2021-04-15 ENCOUNTER — Encounter: Payer: Self-pay | Admitting: Interventional Cardiology

## 2021-04-15 ENCOUNTER — Ambulatory Visit (INDEPENDENT_AMBULATORY_CARE_PROVIDER_SITE_OTHER): Payer: BC Managed Care – PPO | Admitting: Interventional Cardiology

## 2021-04-15 ENCOUNTER — Other Ambulatory Visit: Payer: Self-pay

## 2021-04-15 ENCOUNTER — Other Ambulatory Visit: Payer: Self-pay | Admitting: Pulmonary Disease

## 2021-04-15 VITALS — BP 134/90 | HR 88 | Ht 65.0 in | Wt 223.8 lb

## 2021-04-15 DIAGNOSIS — I700 Atherosclerosis of aorta: Secondary | ICD-10-CM | POA: Diagnosis not present

## 2021-04-15 DIAGNOSIS — I10 Essential (primary) hypertension: Secondary | ICD-10-CM | POA: Diagnosis not present

## 2021-04-15 NOTE — Progress Notes (Signed)
Cardiology Office Note   Date:  04/15/2021   ID:  Jocelyn Bond, DOB [birth year], MRN [id number]  PCP:  Denita Lung, MD    No chief complaint on file.  HTN  Wt Readings from Last 3 Encounters:  04/15/21 223 lb 12.8 oz (101.5 kg)  04/13/21 228 lb 3.2 oz (103.5 kg)  02/24/21 228 lb (103.4 kg)       History of Present Illness: Jocelyn Bond is a 65 y.o. female who is being seen today for the evaluation of ILD at the request of Rice, Resa Miner, MD.  Eco was done in 03/2021: "Left ventricular ejection fraction, by estimation, is 60 to 65%. The  left ventricle has normal function. The left ventricle has no regional  wall motion abnormalities. Left ventricular diastolic parameters are  indeterminate.   2. Right ventricular systolic function is normal. The right ventricular  size is normal. There is normal pulmonary artery systolic pressure. The  estimated right ventricular systolic pressure is 85.2 mmHg.   3. The mitral valve is normal in structure. Trivial mitral valve  regurgitation. No evidence of mitral stenosis.   4. The aortic valve is tricuspid. Aortic valve regurgitation is not  visualized. No aortic stenosis is present.   5. The inferior vena cava is normal in size with greater than 50%  respiratory variability, suggesting right atrial pressure of 3 mmHg. "  She works for [employer] 3rd shift.  She walks some at work.  No cardiac sx.   Denies : Chest pain. Dizziness. Leg edema. Nitroglycerin use. Orthopnea. Palpitations. Paroxysmal nocturnal dyspnea. Shortness of breath. Syncope.    Past Medical History:  Diagnosis Date   Allergy    very rare    Hypertension    Obesity     Past Surgical History:  Procedure Laterality Date   BTL     BUNIONECTOMY     COLONOSCOPY  2009   13 yrs ago [facility]- pt states removed polyps , no report, no path    LUNG BIOPSY  2009   TUBAL LIGATION       Current Outpatient Medications  Medication Sig Dispense Refill    Ginkgo Biloba 40 MG TABS Take by mouth.     glucosamine-chondroitin 500-400 MG tablet Take 1 tablet by mouth 3 (three) times daily.     Gotu [place], Centella asiatica, (GOTU KOLA PO) Take 1 capsule by mouth.     hydrochlorothiazide (MICROZIDE) 12.5 MG capsule TAKE 1 CAPSULE BY MOUTH EVERY DAY 90 capsule 0   losartan (COZAAR) 50 MG tablet TAKE 1 TABLET BY MOUTH EVERY DAY 90 tablet 0   Magnesium 250 MG TABS Take by mouth.     Multiple Vitamins-Minerals (MULTIVITAMIN WITH MINERALS) tablet Take 1 tablet by mouth daily.     OVER THE COUNTER MEDICATION Lung and bronchial     OVER THE COUNTER MEDICATION optihear     STIOLTO RESPIMAT 2.5-2.5 MCG/ACT AERS INHALE 2 PUFFS BY MOUTH INTO THE LUNGS DAILY 4 g 2   No current facility-administered medications for this visit.    Allergies:   Patient has no known allergies.    Social History:  The patient  reports that she has never smoked. She has never used smokeless tobacco. She reports current alcohol use of about 1.0 standard drink per week. She reports that she does not use drugs.   Family History:  The patient's family history includes Arthritis/Rheumatoid in her sister; Cancer in her sister; Fibromyalgia in her sister; Healthy in her  daughter, daughter, and son; Hypertension in her father, mother, sister, sister, sister, and sister.    ROS:  Please see the history of present illness.   Otherwise, review of systems are positive for cough.   All other systems are reviewed and negative.    PHYSICAL EXAM: VS:  BP 134/90   Pulse 88   Ht 5\' 5"  (1.651 m)   Wt 223 lb 12.8 oz (101.5 kg)   SpO2 97%   BMI 37.24 kg/m  , BMI Body mass index is 37.24 kg/m. GEN: Well nourished, well developed, in no acute distress HEENT: normal Neck: no JVD, carotid bruits, or masses Cardiac: RRR, premature beats; no murmurs, rubs, or gallops,no edema  Respiratory:  clear to auscultation bilaterally, normal work of breathing GI: soft, nontender, nondistended, + BS MS: no  deformity or atrophy Skin: warm and dry, no rash Neuro:  Strength and sensation are intact Psych: euthymic mood, full affect   EKG:   The ekg ordered today demonstrates NSR, no ST changes   Recent Labs: 06/23/2020: ALT 17; BUN 11; Creatinine, Ser 1.11; Hemoglobin 13.6; Platelets 273; Potassium 3.7; Sodium 141   Lipid Panel    Component Value Date/Time   CHOL 213 (H) 06/23/2020 1453   TRIG 65 06/23/2020 1453   HDL 61 06/23/2020 1453   CHOLHDL 3.5 06/23/2020 1453   CHOLHDL 3.1 12/19/2015 0825   VLDL 12 12/19/2015 0825   LDLCALC 140 (H) 06/23/2020 1453     Other studies Reviewed: Additional studies/ records that were reviewed today with results demonstrating: labs reviewed; CT showing ILD.   ASSESSMENT AND PLAN:  HTN: The current medical regimen is effective;  continue present plan and medications.  She checks outside of MDs office and readings are controlled.   No pericardial effusion.  Normal LV/RV function.  Normal ECG. No evidence of pulm HTN by echo.  Exercise target noted below.  "Talk test" explained.  This is a reasonable target for her exercise.  Aortic atherosclerosis:  Noted on CT scan.  LDL 140.  I would recommend starting statin therapy, rosuvastatin 20 mg daily.  Will check with her PMD nad lung doctor if this would be ok to use with any potential meds for her ILD and autoimmune issues.     Current medicines are reviewed at length with the patient today.  The patient concerns regarding her medicines were addressed.  The following changes have been made:  No change  Labs/ tests ordered today include:  No orders of the defined types were placed in this encounter.   Recommend 150 minutes/week of aerobic exercise Low fat, low carb, high fiber diet recommended  Disposition:   FU as needed.   Signed, Larae Grooms, MD  04/15/2021 3:54 PM    [facility] [place], [place], [place]  [address] Phone: [phone]; Fax: [phone]

## 2021-04-15 NOTE — Patient Instructions (Signed)
Medication Instructions:  Your physician recommends that you continue on your current medications as directed. Please refer to the Current Medication list given to you today.  *If you need a refill on your cardiac medications before your next appointment, please call your pharmacy*   Lab Work: none If you have labs (blood work) drawn today and your tests are completely normal, you will receive your results only by: [place] (if you have MyChart) OR A paper copy in the mail If you have any lab test that is abnormal or we need to change your treatment, we will call you to review the results.   Testing/Procedures: none   Follow-Up: At [facility], you and your health needs are our priority.  As part of our continuing mission to provide you with exceptional heart care, we have created designated Provider Care Teams.  These Care Teams include your primary Cardiologist (physician) and Advanced Practice Providers (APPs -  Physician Assistants and Nurse Practitioners) who all work together to provide you with the care you need, when you need it.  We recommend signing up for the patient portal called "MyChart".  Sign up information is provided on this After Visit Summary.  MyChart is used to connect with patients for Virtual Visits (Telemedicine).  Patients are able to view lab/test results, encounter notes, upcoming appointments, etc.  Non-urgent messages can be sent to your provider as well.   To learn more about what you can do with MyChart, go to [email or web address].    Your next appointment:   As needed  The format for your next appointment:   In Person  Provider:   You may see Larae Grooms, MD or one of the following Advanced Practice Providers on your designated Care Team:   Melina Copa, PA-C Ermalinda Barrios, PA-C   Other Instructions

## 2021-04-22 ENCOUNTER — Ambulatory Visit: Payer: BC Managed Care – PPO | Attending: Family Medicine | Admitting: Audiologist

## 2021-04-22 DIAGNOSIS — H903 Sensorineural hearing loss, bilateral: Secondary | ICD-10-CM | POA: Insufficient documentation

## 2021-04-22 NOTE — Procedures (Signed)
  Outpatient Audiology and [address], [place]  [id number] [phone]  AUDIOLOGICAL  EVALUATION  NAME: Jocelyn Bond     DOB:   February 22, 1956      MRN: [id number]                                                                                     DATE: 04/22/2021     REFERENT: Jocelyn Lung, MD STATUS: Outpatient DIAGNOSIS: Severe Sensorineural Hearing Loss Bilateral Asymmetric     History: Jocelyn Bond was seen for an audiological evaluation.  Jocelyn Bond is receiving a hearing evaluation due to concerns for difficulty hearing on a daily basis, especially from her right ear. Jocelyn Bond has difficulty hearing in background noise, crowds, and when people are at a distance. She used to lip read but masks have made this difficult. She  has had this hearing loss most of her life. She remembers having painful ear infections as a child. She does not remember when this hearing loss started. She wore hearing aids in the 90s. She felt like her hearing improved and stopped wearing them. No pain or pressure reported in either ear. Tinnitus denied for both ears. No other relevant case history reported. No audiograms found in medical review.   Evaluation:  Otoscopy showed a clear view of the tympanic membranes, bilaterally Tympanometry results were consistent with normal middle ear function, bilaterally   Audiometric testing was completed using conventional audiometry with insert transducer. Speech Recognition Thresholds were consistent with pure tone averages. Word Recognition was poor an elevated level, 12% in the right ear at 100dB and 56% in the left ear at 95dB. Pure tone thresholds show moderate to severe sensorineural hearing loss in the left ear and moderate to profound sensorineural hearing loss in the right ear. Test results are consistent with asymmetric hearing loss.   Results:  The test results were reviewed with Jocelyn Bond. She has likely been lip reading to  understand speech. She needs a communication needs consult with an audiologist that works with ENT and amplification. She needs to trial hearing aids to see if they are beneficial. Jocelyn Bond was given a copy of her hearing test. She was told to expect a call from an ENT office for a medical evaluation and hearing aid consult.  I recommend she see an audiologist who also has experience in consultations for cochlear implants. She was advised not to go to [facility] or another non medical provider for amplification due to the degree of her hearing loss. She reported understanding. She was given a list of local providers.   Recommendations: Amplification is necessary for both ears. Jocelyn Bond will need medical clearance for hearing aids due to asymmetry.  Referral to ENT Physician necessary due to asymmetric profound hearing loss.    Alfonse Alpers  Audiologist, Au.D., CCC-A 04/22/2021  4:20 PM  Cc: Jocelyn Lung, MD

## 2021-04-30 NOTE — Telephone Encounter (Signed)
Called and spoke with patient who states that she has a cold that seems to be getting better with Coricidin. She is wondering if there is any other specific medicine that she should be taking for colds along with condition, or is  Coricidin ok?   Please advise

## 2021-05-04 ENCOUNTER — Telehealth (INDEPENDENT_AMBULATORY_CARE_PROVIDER_SITE_OTHER): Payer: BC Managed Care – PPO | Admitting: Family Medicine

## 2021-05-04 ENCOUNTER — Other Ambulatory Visit: Payer: Self-pay

## 2021-05-04 ENCOUNTER — Encounter: Payer: Self-pay | Admitting: Family Medicine

## 2021-05-04 VITALS — Temp 97.0°F | Wt 223.0 lb

## 2021-05-04 DIAGNOSIS — J069 Acute upper respiratory infection, unspecified: Secondary | ICD-10-CM | POA: Diagnosis not present

## 2021-05-04 DIAGNOSIS — J849 Interstitial pulmonary disease, unspecified: Secondary | ICD-10-CM | POA: Diagnosis not present

## 2021-05-04 NOTE — Progress Notes (Signed)
   Subjective:    Patient ID: Jocelyn Bond, female    DOB: 1956/05/24, 65 y.o.   MRN: [id number]  HPI Documentation for virtual audio and video telecommunications through [facility] encounter: The patient was located at home. 2 patient identifiers used.  The provider was located in the office. The patient did consent to this visit and is aware of possible charges through their insurance for this visit. The other persons participating in this telemedicine service were none. Time spent on call was 4 minutes and in review of previous records >17 minutes total for counseling and coordination of care. This virtual service is not related to other E/M service within previous 7 days.  She complains of a 1 week history of started with sore throat, dry cough that became productive but no fever, chills, earache.  She has been using Coricidin D.  She does have an underlying history of ILD and has seen pulmonary for this.  She presently is taking Stiolto.  Review of Systems     Objective:   Physical Exam Alert and in no distress.  No tachypnea noted.       Assessment & Plan:  ILD (interstitial lung disease) ([place])  Viral URI with cough I explained that at this time it is hard to say whether an antibiotic would be useful since she is roughly at 1 week.  She is to wait a couple more days and if still having symptoms I explained that I would probably then place her on an antibiotic and treat her as if she had a secondary bacterial infection.  She was comfortable with that.

## 2021-05-05 ENCOUNTER — Encounter: Payer: Self-pay | Admitting: Physician Assistant

## 2021-05-05 ENCOUNTER — Ambulatory Visit (INDEPENDENT_AMBULATORY_CARE_PROVIDER_SITE_OTHER): Payer: BC Managed Care – PPO | Admitting: Physician Assistant

## 2021-05-05 ENCOUNTER — Telehealth: Payer: BC Managed Care – PPO | Admitting: Family Medicine

## 2021-05-05 VITALS — BP 120/80 | HR 92 | Ht 65.0 in | Wt 225.0 lb

## 2021-05-05 DIAGNOSIS — R1310 Dysphagia, unspecified: Secondary | ICD-10-CM

## 2021-05-05 NOTE — Progress Notes (Signed)
Subjective:    Patient ID: Jocelyn Bond, female    DOB: 08/15/1955, 65 y.o.   MRN: [id number]  HPI Briceida is a pleasant 65 year old African-American female, established with Dr. Ardis Hughs, who was referred today by her PCP/Dr. Redmond School for evaluation of intermittent solid food dysphagia.  She has not had any prior EGD. She did undergo colonoscopy in February 2021 with finding of a 2 mm rectal polyp which was hyperplastic and noted to have left colon diverticulosis.  Prior colonoscopy had been done through [facility]. Patient has history of hypertension, interstitial lung disease, obesity, and positive ANA.  She says that she has had some sporadic episodes of solid food dysphagia which definitely occur less than 1 time per month.  With these episodes she will have a sensation that her food is being very slow to traverse the esophagus, she has not had any episodes requiring regurgitation.  No regular heartburn or indigestion, no abdominal pain.  No nausea.  Appetite has been fine, weight has been stable.  Review of Systems  Pertinent positive and negative review of systems were noted in the above HPI section.  All other review of systems was otherwise negative.   Outpatient Encounter Medications as of 05/05/2021  Medication Sig   Ginkgo Biloba 40 MG TABS Take by mouth.   glucosamine-chondroitin 500-400 MG tablet Take 1 tablet by mouth 3 (three) times daily.   Gotu [place], Centella asiatica, (GOTU KOLA PO) Take 1 capsule by mouth.   hydrochlorothiazide (MICROZIDE) 12.5 MG capsule TAKE 1 CAPSULE BY MOUTH EVERY DAY   losartan (COZAAR) 50 MG tablet TAKE 1 TABLET BY MOUTH EVERY DAY   Magnesium 250 MG TABS Take by mouth.   Multiple Vitamins-Minerals (MULTIVITAMIN WITH MINERALS) tablet Take 1 tablet by mouth daily.   OVER THE COUNTER MEDICATION Lung and bronchial   OVER THE COUNTER MEDICATION optihear   STIOLTO RESPIMAT 2.5-2.5 MCG/ACT AERS INHALE 2 PUFFS BY MOUTH INTO THE LUNGS DAILY   No  facility-administered encounter medications on file as of 05/05/2021.   No Known Allergies Patient Active Problem List   Diagnosis Date Noted   Rash and other nonspecific skin eruption 02/03/2021   Positive ANA (antinuclear antibody) 01/29/2021   ILD (interstitial lung disease) ([place]) 01/29/2021   High risk medication use 01/29/2021   Seborrheic keratosis 06/20/2019   Lymphadenopathy of head and neck 02/16/2016   Chronic venous insufficiency 12/11/2012   Obesity (BMI 30-39.9) 12/11/2012   Essential hypertension 01/09/2008   Social History   Socioeconomic History   Marital status: Single    Spouse name: Not on file   Number of children: Not on file   Years of education: Not on file   Highest education level: Not on file  Occupational History   Not on file  Tobacco Use   Smoking status: Never   Smokeless tobacco: Never  Vaping Use   Vaping Use: Never used  Substance and Sexual Activity   Alcohol use: Yes    Alcohol/week: 1.0 standard drink    Types: 1 Glasses of wine per week    Comment: rare    Drug use: No   Sexual activity: Not Currently  Other Topics Concern   Not on file  Social History Narrative   Not on file   Social Determinants of Health   Financial Resource Strain: Not on file  Food Insecurity: Not on file  Transportation Needs: Not on file  Physical Activity: Not on file  Stress: Not on file  Social Connections:  Not on file  Intimate Partner Violence: Not on file    Ms. Whisnant's family history includes Arthritis/Rheumatoid in her sister; Cancer in her sister; Fibromyalgia in her sister; Healthy in her daughter, daughter, and son; Hypertension in her father, mother, sister, sister, sister, and sister.      Objective:    Vitals:   05/05/21 0854  BP: 120/80  Pulse: 92    Physical Exam Well-developed well-nourished African-American female in no acute distress.  Weight, 225 BMI 37.4  HEENT; nontraumatic normocephalic, EOMI, PE R LA, sclera  anicteric.  Hard of hearing Oropharynx; not examined today Neck; supple, no JVD Cardiovascular; regular rate and rhythm with S1-S2, no murmur rub or gallop Pulmonary; Clear bilaterally Abdomen; soft, obese ,nontender, nondistended, no palpable mass or hepatosplenomegaly, bowel sounds are active Rectal; not done today Skin; benign exam, no jaundice rash or appreciable lesions Extremities; no clubbing cyanosis or edema skin warm and dry Neuro/Psych; alert and oriented x4, grossly nonfocal mood and affect appropriate        Assessment & Plan:   #49 65 year old African-American female with sporadic episodes of solid food dysphagia occurring over the past couple of years.  Episodes are occurring less than once per month and do not require regurgitation. No associated heartburn or indigestion, or abdominal pain.  Appetite good and weight has been stable  It does not sound as if she has a significant stricture as her episodes are very sporadic.  We will need to rule out esophageal stricture, esophageal ring, esophageal dysmotility.  #2 diverticulosis #3.  Colon cancer screening-up-to-date with colonoscopy February 2021 with removal of 1 diminutive hyperplastic rectal polyp #4 hypertension #5.  Interstitial lung disease #6.  Positive ANA  Plan; Patient will be scheduled for a barium swallow with tablet  Further recommendations pending findings of barium swallow.  If she indeed does have an esophageal stricture she can be scheduled for EGD with dilation with Dr. Ardis Hughs.    Kayelynn Abdou Genia Harold PA-C 05/05/2021   Cc: Denita Lung, MD

## 2021-05-05 NOTE — Patient Instructions (Signed)
If you are age 64 or older, your body mass index should be between 23-30. Your Body mass index is 37.44 kg/m. If this is out of the aforementioned range listed, please consider follow up with your Primary Care Provider.  If you are age 25 or younger, your body mass index should be between 19-25. Your Body mass index is 37.44 kg/m. If this is out of the aformentioned range listed, please consider follow up with your Primary Care Provider.  ________________________________________________________  The [facility] providers would like to encourage you to use [facility] to communicate with providers for non-urgent requests or questions.  Due to long hold times on the telephone, sending your provider a message by [facility] may be a faster and more efficient way to get a response.  Please allow 48 business hours for a response.  Please remember that this is for non-urgent requests.  _______________________________________________________  Jocelyn Bond have been scheduled for a Barium Esophogram at [facility] (1st floor of the hospital) on 05/20/2021 at 10:30 am. Please arrive 15 minutes prior to your appointment for registration. Make certain not to have anything to eat or drink 3 hours prior to your test. If you need to reschedule for any reason, please contact radiology at [phone] to do so. __________________________________________________________________ A barium swallow is an examination that concentrates on views of the esophagus. This tends to be a double contrast exam (barium and two liquids which, when combined, create a gas to distend the wall of the oesophagus) or single contrast (non-ionic iodine based). The study is usually tailored to your symptoms so a good history is essential. Attention is paid during the study to the form, structure and configuration of the esophagus, looking for functional disorders (such as aspiration, dysphagia, achalasia, motility and reflux) EXAMINATION You may be  asked to change into a gown, depending on the type of swallow being performed. A radiologist and radiographer will perform the procedure. The radiologist will advise you of the type of contrast selected for your procedure and direct you during the exam. You will be asked to stand, sit or lie in several different positions and to hold a small amount of fluid in your mouth before being asked to swallow while the imaging is performed .In some instances you may be asked to swallow barium coated marshmallows to assess the motility of a solid food bolus. The exam can be recorded as a digital or video fluoroscopy procedure. POST PROCEDURE It will take 1-2 days for the barium to pass through your system. To facilitate this, it is important, unless otherwise directed, to increase your fluids for the next 24-48hrs and to resume your normal diet.  This test typically takes about 30 minutes to perform. ____________________________________________________________  Follow up pending the results of your Barium Swallow.  Thank you for entrusting me with your care and choosing [facility].  Amy Esterwood, PA-C

## 2021-05-06 ENCOUNTER — Telehealth: Payer: Self-pay

## 2021-05-06 NOTE — Progress Notes (Signed)
I agree with the above note, plan 

## 2021-05-06 NOTE — Telephone Encounter (Signed)
Pt. Had virtual apt with you on 05/04/21 and was told to call back if today and let you know how she is doing. She said she still has a cough and a lot of congestion.

## 2021-05-11 ENCOUNTER — Telehealth: Payer: Self-pay | Admitting: *Deleted

## 2021-05-11 ENCOUNTER — Other Ambulatory Visit: Payer: Self-pay | Admitting: Family Medicine

## 2021-05-11 DIAGNOSIS — I10 Essential (primary) hypertension: Secondary | ICD-10-CM

## 2021-05-11 MED ORDER — ROSUVASTATIN CALCIUM 20 MG PO TABS: 20.0000 mg | ORAL_TABLET | Freq: Every day | ORAL | 3 refills | Status: DC

## 2021-05-11 NOTE — Telephone Encounter (Signed)
-----   Message from Jettie Booze, MD sent at 05/11/2021  8:43 AM EST ----- OK to start rosuvastatin 20 mg daily.  Repeat liver and lipids in 3 months. ----- Message ----- From: Freddi Starr, MD Sent: 05/10/2021   2:21 PM EST To: Jettie Booze, MD, Denita Lung, MD, #  No issues from pulmonary standpoint to start statin.   Thanks, Jon  ----- Message ----- From: Jettie Booze, MD Sent: 04/15/2021   4:40 PM EST To: Denita Lung, MD, Collier Salina, MD, #  I think she is stable from a cardaic standpoint.  She would benefit from a statin as she has ortic atherosclerosis and high LDL.  WOuld start rosuvasatatin 20 mg daily if ok to take with her rheum and pulm meds.  I will defer to this group.  I will see her back as needed.  Thanks.

## 2021-05-11 NOTE — Telephone Encounter (Signed)
Patient notified.  She is seeing Dr Redmond School on 12/27 and lab work is to be done at this appointment. Prescription sent to [facility] on [address]

## 2021-05-20 ENCOUNTER — Ambulatory Visit (HOSPITAL_COMMUNITY)
Admission: RE | Admit: 2021-05-20 | Discharge: 2021-05-20 | Disposition: A | Discharge status: Home / Self Care | Payer: BC Managed Care – PPO | Source: Ambulatory Visit | Attending: Physician Assistant | Admitting: Physician Assistant

## 2021-05-20 DIAGNOSIS — R1310 Dysphagia, unspecified: Secondary | ICD-10-CM | POA: Diagnosis present

## 2021-06-11 ENCOUNTER — Encounter: Payer: Self-pay | Admitting: Audiologist

## 2021-06-12 ENCOUNTER — Telehealth: Payer: Self-pay | Admitting: Family Medicine

## 2021-06-12 NOTE — Telephone Encounter (Signed)
PT called to check if we had put ENT referral, She states information was sent to [place] from [facility] who did hearing test

## 2021-06-15 ENCOUNTER — Other Ambulatory Visit: Payer: Self-pay

## 2021-06-15 DIAGNOSIS — H9190 Unspecified hearing loss, unspecified ear: Secondary | ICD-10-CM

## 2021-06-15 NOTE — Telephone Encounter (Signed)
Put in urgent. [place]

## 2021-06-30 ENCOUNTER — Encounter: Payer: Self-pay | Admitting: Family Medicine

## 2021-06-30 ENCOUNTER — Ambulatory Visit (INDEPENDENT_AMBULATORY_CARE_PROVIDER_SITE_OTHER): Payer: BC Managed Care – PPO | Admitting: Family Medicine

## 2021-06-30 ENCOUNTER — Other Ambulatory Visit: Payer: Self-pay

## 2021-06-30 VITALS — BP 130/86 | HR 90 | Temp 98.0°F | Ht 65.0 in | Wt 224.6 lb

## 2021-06-30 DIAGNOSIS — I700 Atherosclerosis of aorta: Secondary | ICD-10-CM

## 2021-06-30 DIAGNOSIS — E669 Obesity, unspecified: Secondary | ICD-10-CM | POA: Diagnosis not present

## 2021-06-30 DIAGNOSIS — R768 Other specified abnormal immunological findings in serum: Secondary | ICD-10-CM

## 2021-06-30 DIAGNOSIS — Z23 Encounter for immunization: Secondary | ICD-10-CM | POA: Diagnosis not present

## 2021-06-30 DIAGNOSIS — I10 Essential (primary) hypertension: Secondary | ICD-10-CM

## 2021-06-30 DIAGNOSIS — E785 Hyperlipidemia, unspecified: Secondary | ICD-10-CM

## 2021-06-30 DIAGNOSIS — I872 Venous insufficiency (chronic) (peripheral): Secondary | ICD-10-CM | POA: Diagnosis not present

## 2021-06-30 DIAGNOSIS — J849 Interstitial pulmonary disease, unspecified: Secondary | ICD-10-CM | POA: Diagnosis not present

## 2021-06-30 DIAGNOSIS — Z0000 Encounter for general adult medical examination without abnormal findings: Secondary | ICD-10-CM | POA: Diagnosis not present

## 2021-06-30 DIAGNOSIS — H9190 Unspecified hearing loss, unspecified ear: Secondary | ICD-10-CM | POA: Insufficient documentation

## 2021-06-30 MED ORDER — ROSUVASTATIN CALCIUM 20 MG PO TABS
20.0000 mg | ORAL_TABLET | Freq: Every day | ORAL | 3 refills | Status: DC
Start: 2021-06-30 — End: 2022-07-14

## 2021-06-30 MED ORDER — HYDROCHLOROTHIAZIDE 12.5 MG PO CAPS: ORAL_CAPSULE | ORAL | 3 refills | Status: DC

## 2021-06-30 MED ORDER — STIOLTO RESPIMAT 2.5-2.5 MCG/ACT IN AERS: INHALATION_SPRAY | RESPIRATORY_TRACT | 3 refills | Status: DC

## 2021-06-30 MED ORDER — LOSARTAN POTASSIUM 50 MG PO TABS: 50.0000 mg | ORAL_TABLET | Freq: Every day | ORAL | 3 refills | Status: DC

## 2021-06-30 NOTE — Progress Notes (Signed)
° °  Subjective:    Patient ID: Jocelyn Bond, female    DOB: 1955-07-22, 65 y.o.   MRN: [id number]  HPI She is here for complete examination.  She is set up to get further hearing evaluation January 21.  She continues on HCTZ as well as Cozaar and having no difficulty with that.  She takes Crestor without any muscle aches and pains.  She does have a history of aortic atherosclerosis.  She is on her inhaler and having no trouble with that.  She does take over-the-counter supplements of various kinds.  She does state that she has been having intermittent bilateral low back pain that goes away with the use of Aleve fairly quickly.  She apparently did have a barium swallow but apparently this was negative except for instructed her to chew her food up to diminish the likelihood of difficulty with swallowing.  She has no other concerns or complaints.   Review of Systems  All other systems reviewed and are negative.     Objective:   Physical Exam Alert and in no distress. Tympanic membranes and canals are normal. Pharyngeal area is normal. Neck is supple without adenopathy or thyromegaly. Cardiac exam shows a regular sinus rhythm without murmurs or gallops. Lungs are clear to auscultation. Abdominal exam shows no masses or tenderness with normal bowel sounds       Assessment & Plan:  Routine general medical examination at a health care facility - Plan: CBC with Differential/Platelet, Comprehensive metabolic panel, Lipid panel  Chronic venous insufficiency - Plan: hydrochlorothiazide (MICROZIDE) 12.5 MG capsule  Essential hypertension - Plan: losartan (COZAAR) 50 MG tablet, hydrochlorothiazide (MICROZIDE) 12.5 MG capsule  ILD (interstitial lung disease) (HCC) - Plan: Tiotropium Bromide-Olodaterol (STIOLTO RESPIMAT) 2.5-2.5 MCG/ACT AERS  Obesity (BMI 30-39.9)  Positive ANA (antinuclear antibody)  High frequency hearing loss, unspecified laterality  Need for vaccination against  Streptococcus pneumoniae - Plan: Pneumococcal conjugate vaccine 20-valent  Hyperlipidemia, unspecified hyperlipidemia type - Plan: rosuvastatin (CRESTOR) 20 MG tablet  Aortic atherosclerosis ([place]) She will continue on her present medication regimen.  Follow-up with audiology concerning getting hearing aids as she definitely needs them.  Strongly encouraged her to become more physically active. Discussed the intermittent pain and since it is intermittent explained that this has been much less concerning.

## 2021-07-01 LAB — CBC WITH DIFFERENTIAL/PLATELET
Basophils Absolute: 0.1 10*3/uL (ref 0.0–0.2)
Basos: 1 %
EOS (ABSOLUTE): 0.4 10*3/uL (ref 0.0–0.4)
Eos: 5 %
Hematocrit: 41.3 % (ref 34.0–46.6)
Hemoglobin: 13.5 g/dL (ref 11.1–15.9)
Immature Grans (Abs): 0 10*3/uL (ref 0.0–0.1)
Immature Granulocytes: 0 %
Lymphocytes Absolute: 2.3 10*3/uL (ref 0.7–3.1)
Lymphs: 29 %
MCH: 29 pg (ref 26.6–33.0)
MCHC: 32.7 g/dL (ref 31.5–35.7)
MCV: 89 fL (ref 79–97)
Monocytes Absolute: 1.2 10*3/uL — ABNORMAL HIGH (ref 0.1–0.9)
Monocytes: 16 %
Neutrophils Absolute: 3.8 10*3/uL (ref 1.4–7.0)
Neutrophils: 49 %
Platelets: 218 10*3/uL (ref 150–450)
RBC: 4.66 x10E6/uL (ref 3.77–5.28)
RDW: 13.3 % (ref 11.7–15.4)
WBC: 7.8 10*3/uL (ref 3.4–10.8)

## 2021-07-01 LAB — LIPID PANEL
Chol/HDL Ratio: 2.8 ratio (ref 0.0–4.4)
Cholesterol, Total: 174 mg/dL (ref 100–199)
HDL: 62 mg/dL (ref >39)
LDL Chol Calc (NIH): 100 mg/dL — ABNORMAL HIGH (ref 0–99)
Triglycerides: 60 mg/dL (ref 0–149)
VLDL Cholesterol Cal: 12 mg/dL (ref 5–40)

## 2021-07-01 LAB — COMPREHENSIVE METABOLIC PANEL
ALT: 23 IU/L (ref 0–32)
AST: 29 IU/L (ref 0–40)
Albumin/Globulin Ratio: 1.1 — ABNORMAL LOW (ref 1.2–2.2)
Albumin: 3.6 g/dL — ABNORMAL LOW (ref 3.8–4.8)
Alkaline Phosphatase: 87 IU/L (ref 44–121)
BUN/Creatinine Ratio: 13 (ref 12–28)
BUN: 13 mg/dL (ref 8–27)
Bilirubin Total: 0.4 mg/dL (ref 0.0–1.2)
CO2: 29 mmol/L (ref 20–29)
Calcium: 9.1 mg/dL (ref 8.7–10.3)
Chloride: 103 mmol/L (ref 96–106)
Creatinine, Ser: 0.98 mg/dL (ref 0.57–1.00)
Globulin, Total: 3.3 g/dL (ref 1.5–4.5)
Glucose: 84 mg/dL (ref 70–99)
Potassium: 4.1 mmol/L (ref 3.5–5.2)
Sodium: 142 mmol/L (ref 134–144)
Total Protein: 6.9 g/dL (ref 6.0–8.5)
eGFR: 64 mL/min/{1.73_m2} (ref >59)

## 2021-07-02 ENCOUNTER — Encounter: Payer: Self-pay | Admitting: *Deleted

## 2021-09-10 ENCOUNTER — Encounter: Payer: Self-pay | Admitting: Family Medicine

## 2021-10-11 ENCOUNTER — Ambulatory Visit
Admission: EM | Admit: 2021-10-11 | Discharge: 2021-10-11 | Disposition: A | Discharge status: Home / Self Care | Payer: BC Managed Care – PPO | Attending: Student | Admitting: Student

## 2021-10-11 ENCOUNTER — Encounter: Payer: Self-pay | Admitting: Emergency Medicine

## 2021-10-11 ENCOUNTER — Other Ambulatory Visit: Payer: Self-pay

## 2021-10-11 DIAGNOSIS — H9190 Unspecified hearing loss, unspecified ear: Secondary | ICD-10-CM

## 2021-10-11 DIAGNOSIS — Z1152 Encounter for screening for COVID-19: Secondary | ICD-10-CM | POA: Diagnosis not present

## 2021-10-11 NOTE — Discharge Instructions (Addendum)
-  We will call you in 1 to 2 days if your COVID test is positive. ?-With a virus, you're typically contagious for 5-7 days, or as long as you're having fevers.  ?-We will call you if your COVID test is positive, at that time we could also send in antiviral called molnupiravir.  This can help reduce the chance of severe disease. ?-You are welcome to take over-the-counter medications if you develop new symptoms, including shortness of breath, cough, fever/chills. ?-If you become very short of breath, chest pain, coughing up dark sputum-seek additional immediate medical attention. ?

## 2021-10-11 NOTE — ED Provider Notes (Signed)
?[place] ? ? ? ?CSN: [id number] ?Arrival date & time: 10/11/21  1514 ? ? ?  ? ?History   ?Chief Complaint ?Chief Complaint  ?Patient presents with  ? COVID test  ? ? ?HPI ?Jocelyn Bond is a 66 y.o. female presenting following positive home COVID test.  History of interstitial lung disease.  States her only symptom is loss of taste and smell. Denies fevers/chills, n/v/d, shortness of breath, chest pain, cough, congestion, facial pain, teeth pain, headaches, sore throat, loss of taste/smell, swollen lymph nodes, ear pain.  ? ? ?HPI ? ?Past Medical History:  ?Diagnosis Date  ? Allergy   ? very rare   ? Hypertension   ? ILD (interstitial lung disease) ([place])   ? Obesity   ? ? ?Patient Active Problem List  ? Diagnosis Date Noted  ? High frequency hearing loss 06/30/2021  ? Aortic atherosclerosis ([place]) 06/30/2021  ? Positive ANA (antinuclear antibody) 01/29/2021  ? ILD (interstitial lung disease) ([place]) 01/29/2021  ? High risk medication use 01/29/2021  ? Seborrheic keratosis 06/20/2019  ? Lymphadenopathy of head and neck 02/16/2016  ? Chronic venous insufficiency 12/11/2012  ? Obesity (BMI 30-39.9) 12/11/2012  ? Essential hypertension 01/09/2008  ? ? ?Past Surgical History:  ?Procedure Laterality Date  ? BTL    ? BUNIONECTOMY    ? COLONOSCOPY  2009  ? 13 yrs ago [facility]- pt states removed polyps , no report, no path   ? LUNG BIOPSY  2009  ? TUBAL LIGATION    ? ? ?OB History   ?No obstetric history on file. ?  ? ? ? ?Home Medications   ? ?Prior to Admission medications   ?Medication Sig Start Date End Date Taking? Authorizing Provider  ?Ginkgo Biloba 40 MG TABS Take by mouth.   Yes [provider]  ?glucosamine-chondroitin 500-400 MG tablet Take 1 tablet by mouth 3 (three) times daily.   Yes [provider]  ?Gotu [place], Centella asiatica, (GOTU KOLA PO) Take 1 capsule by mouth.   Yes [provider]  ?hydrochlorothiazide (MICROZIDE) 12.5 MG capsule TAKE 1 CAPSULE BY MOUTH EVERY  DAY 06/30/21  Yes Denita Lung, MD  ?losartan (COZAAR) 50 MG tablet Take 1 tablet (50 mg total) by mouth daily. 06/30/21  Yes Denita Lung, MD  ?Magnesium 250 MG TABS Take by mouth.   Yes [provider]  ?Multiple Vitamins-Minerals (MULTIVITAMIN WITH MINERALS) tablet Take 1 tablet by mouth daily.   Yes [provider]  ?OVER THE COUNTER MEDICATION Lung and bronchial   Yes [provider]  ?[place] optihear   Yes [provider]  ?rosuvastatin (CRESTOR) 20 MG tablet Take 1 tablet (20 mg total) by mouth daily. 06/30/21  Yes Denita Lung, MD  ?Tiotropium Bromide-Olodaterol (STIOLTO RESPIMAT) 2.5-2.5 MCG/ACT AERS INHALE 2 PUFFS BY MOUTH INTO THE LUNGS DAILY 06/30/21  Yes Denita Lung, MD  ? ? ?Family History ?Family History  ?Problem Relation Age of Onset  ? Hypertension Mother   ? Hypertension Father   ? Hypertension Sister   ? Cancer Sister   ?     breast ca  ? Hypertension Sister   ? Arthritis/Rheumatoid Sister   ? Hypertension Sister   ? Fibromyalgia Sister   ? Hypertension Sister   ? Healthy Son   ? Healthy Daughter   ? Healthy Daughter   ? Colon cancer Neg Hx   ? Colon polyps Neg Hx   ? Esophageal cancer Neg Hx   ?  Rectal cancer Neg Hx   ? Stomach cancer Neg Hx   ? ? ?Social History ?Social History  ? ?Tobacco Use  ? Smoking status: Never  ? Smokeless tobacco: Never  ?Vaping Use  ? Vaping Use: Never used  ?Substance Use Topics  ? Alcohol use: Yes  ?  Alcohol/week: 1.0 standard drink  ?  Types: 1 Glasses of wine per week  ?  Comment: rare   ? Drug use: No  ? ? ? ?Allergies   ?Patient has no known allergies. ? ? ?Review of Systems ?Review of Systems  ?Constitutional:  Negative for appetite change, chills and fever.  ?HENT:  Negative for congestion, ear pain, rhinorrhea, sinus pressure, sinus pain and sore throat.   ?Eyes:  Negative for redness and visual disturbance.  ?Respiratory:  Negative for cough, chest tightness, shortness of breath and  wheezing.   ?Cardiovascular:  Negative for chest pain and palpitations.  ?Gastrointestinal:  Negative for abdominal pain, constipation, diarrhea, nausea and vomiting.  ?Genitourinary:  Negative for dysuria, frequency and urgency.  ?Musculoskeletal:  Negative for myalgias.  ?Neurological:  Negative for dizziness, weakness and headaches.  ?Psychiatric/Behavioral:  Negative for confusion.   ?All other systems reviewed and are negative. ? ? ?Physical Exam ?Triage Vital Signs ?ED Triage Vitals  ?Enc Vitals Group  ?   BP 10/11/21 1527 (!) 139/97  ?   Pulse Rate 10/11/21 1527 93  ?   Resp 10/11/21 1527 18  ?   Temp 10/11/21 1527 98.3 ?F (36.8 ?C)  ?   Temp Source 10/11/21 1527 Oral  ?   SpO2 10/11/21 1527 95 %  ?   Weight 10/11/21 1528 224 lb 10.4 oz (101.9 kg)  ?   Height 10/11/21 1528 '5\' 5"'$  (1.651 m)  ?   Head Circumference --   ?   Peak Flow --   ?   Pain Score 10/11/21 1527 0  ?   Pain Loc --   ?   Pain Edu? --   ?   Excl. in [place]? --   ? ?No data found. ? ?Updated Vital Signs ?BP (!) 139/97 (BP Location: Left Arm)   Pulse 93   Temp 98.3 ?F (36.8 ?C) (Oral)   Resp 18   Ht '5\' 5"'$  (1.651 m)   Wt 224 lb 10.4 oz (101.9 kg)   SpO2 95%   BMI 37.38 kg/m?  ? ?Visual Acuity ?Right Eye Distance:   ?Left Eye Distance:   ?Bilateral Distance:   ? ?Right Eye Near:   ?Left Eye Near:    ?Bilateral Near:    ? ?Physical Exam ?Vitals reviewed.  ?Constitutional:   ?   General: She is not in acute distress. ?   Appearance: Normal appearance. She is not ill-appearing.  ?HENT:  ?   Head: Normocephalic and atraumatic.  ?   Right Ear: Tympanic membrane, ear canal and external ear normal. No tenderness. No middle ear effusion. There is no impacted cerumen. Tympanic membrane is not perforated, erythematous, retracted or bulging.  ?   Left Ear: Tympanic membrane, ear canal and external ear normal. No tenderness.  No middle ear effusion. There is no impacted cerumen. Tympanic membrane is not perforated, erythematous, retracted or bulging.  ?    Nose: Nose normal. No congestion.  ?   Mouth/Throat:  ?   Mouth: Mucous membranes are moist.  ?   Pharynx: Uvula midline. No oropharyngeal exudate or posterior oropharyngeal erythema.  ?Eyes:  ?   Extraocular Movements: Extraocular  movements intact.  ?   Pupils: Pupils are equal, round, and reactive to light.  ?Cardiovascular:  ?   Rate and Rhythm: Normal rate and regular rhythm.  ?   Heart sounds: Normal heart sounds.  ?Pulmonary:  ?   Effort: Pulmonary effort is normal.  ?   Breath sounds: Normal breath sounds. No decreased breath sounds, wheezing, rhonchi or rales.  ?Abdominal:  ?   Palpations: Abdomen is soft.  ?   Tenderness: There is no abdominal tenderness. There is no guarding or rebound.  ?Lymphadenopathy:  ?   Cervical: No cervical adenopathy.  ?   Right cervical: No superficial cervical adenopathy. ?   Left cervical: No superficial cervical adenopathy.  ?Neurological:  ?   General: No focal deficit present.  ?   Mental Status: She is alert and oriented to person, place, and time.  ?Psychiatric:     ?   Mood and Affect: Mood normal.     ?   Behavior: Behavior normal.     ?   Thought Content: Thought content normal.     ?   Judgment: Judgment normal.  ? ? ? ?UC Treatments / Results  ?Labs ?(all labs ordered are listed, but only abnormal results are displayed) ?Labs Reviewed  ?NOVEL CORONAVIRUS, NAA  ? ? ?EKG ? ? ?Radiology ?No results found. ? ?Procedures ?Procedures (including critical care time) ? ?Medications Ordered in UC ?Medications - No data to display ? ?Initial Impression / Assessment and Plan / UC Course  ?I have reviewed the triage vital signs and the nursing notes. ? ?Pertinent labs & imaging results that were available during my care of the patient were reviewed by me and considered in my medical decision making (see chart for details). ? ?  ? ?This patient is a very pleasant 66 y.o. year old female presenting with loss of taste and smell - positive home covid test. Today this pt is afebrile  nontachycardic nontachypneic, oxygenating well on room air, no wheezes rhonchi or rales.  ? ?Patient is very hard of hearing, and I had difficulty communicating with her during the visit.  I ultimately wrote down f

## 2021-10-11 NOTE — ED Triage Notes (Signed)
Patient states that she tested positive for COVID today at home and would like a confirmation test done.  Patient cannot taste or smell. ?

## 2021-10-12 LAB — NOVEL CORONAVIRUS, NAA: SARS-CoV-2, NAA: DETECTED — AB

## 2021-10-13 ENCOUNTER — Telehealth (HOSPITAL_COMMUNITY): Payer: Self-pay | Admitting: Emergency Medicine

## 2021-10-13 MED ORDER — MOLNUPIRAVIR EUA 200MG CAPSULE: 4.0000 | ORAL_CAPSULE | Freq: Two times a day (BID) | ORAL | 0 refills | Status: AC

## 2021-10-13 NOTE — Telephone Encounter (Signed)
Opened in error

## 2021-10-23 ENCOUNTER — Telehealth: Payer: Self-pay

## 2021-10-23 NOTE — Telephone Encounter (Signed)
Pt. Called stating she tested positive for covid back on 10/10/21 and took the covid medication for 5 days. She just wanted to check with you to see if she needed to f/u with you or needed to do anything else with her condition and having covid recently. She stats she is doing pretty good, no ongoing symptoms, just has a slight cough which she usually has due to her condition.  ?

## 2022-01-11 ENCOUNTER — Other Ambulatory Visit: Payer: Self-pay | Admitting: Family Medicine

## 2022-01-11 DIAGNOSIS — J849 Interstitial pulmonary disease, unspecified: Secondary | ICD-10-CM

## 2022-01-11 NOTE — Telephone Encounter (Signed)
[facility] is requesting to fill pt tiotropium . Please advise [facility]

## 2022-03-12 LAB — HM MAMMOGRAPHY

## 2022-07-07 ENCOUNTER — Ambulatory Visit: Payer: BC Managed Care – PPO | Admitting: Family Medicine

## 2022-07-14 ENCOUNTER — Ambulatory Visit: Payer: Medicare Other | Admitting: Family Medicine

## 2022-07-14 ENCOUNTER — Ambulatory Visit: Payer: BC Managed Care – PPO | Admitting: Family Medicine

## 2022-07-14 ENCOUNTER — Other Ambulatory Visit (HOSPITAL_COMMUNITY)
Admission: RE | Admit: 2022-07-14 | Discharge: 2022-07-14 | Disposition: A | Discharge status: Home / Self Care | Payer: BC Managed Care – PPO | Source: Ambulatory Visit | Attending: Family Medicine | Admitting: Family Medicine

## 2022-07-14 ENCOUNTER — Encounter: Payer: Self-pay | Admitting: Family Medicine

## 2022-07-14 VITALS — BP 132/94 | HR 76 | Resp 14 | Ht 64.25 in | Wt 219.6 lb

## 2022-07-14 DIAGNOSIS — E669 Obesity, unspecified: Secondary | ICD-10-CM | POA: Diagnosis not present

## 2022-07-14 DIAGNOSIS — J849 Interstitial pulmonary disease, unspecified: Secondary | ICD-10-CM

## 2022-07-14 DIAGNOSIS — I700 Atherosclerosis of aorta: Secondary | ICD-10-CM | POA: Diagnosis not present

## 2022-07-14 DIAGNOSIS — I10 Essential (primary) hypertension: Secondary | ICD-10-CM

## 2022-07-14 DIAGNOSIS — Z0000 Encounter for general adult medical examination without abnormal findings: Secondary | ICD-10-CM | POA: Diagnosis not present

## 2022-07-14 DIAGNOSIS — Z124 Encounter for screening for malignant neoplasm of cervix: Secondary | ICD-10-CM | POA: Diagnosis present

## 2022-07-14 DIAGNOSIS — R768 Other specified abnormal immunological findings in serum: Secondary | ICD-10-CM

## 2022-07-14 DIAGNOSIS — E785 Hyperlipidemia, unspecified: Secondary | ICD-10-CM

## 2022-07-14 DIAGNOSIS — Z13820 Encounter for screening for osteoporosis: Secondary | ICD-10-CM

## 2022-07-14 LAB — LIPID PANEL

## 2022-07-14 MED ORDER — LOSARTAN POTASSIUM-HCTZ 50-12.5 MG PO TABS
1.0000 | ORAL_TABLET | Freq: Every day | ORAL | 3 refills | Status: DC
Start: 2022-07-14 — End: 2023-07-25

## 2022-07-14 MED ORDER — ROSUVASTATIN CALCIUM 20 MG PO TABS: 20.0000 mg | ORAL_TABLET | Freq: Every day | ORAL | 3 refills | Status: DC

## 2022-07-14 NOTE — Patient Instructions (Signed)
Check out the DASH diet and this check you again in a month or 2

## 2022-07-14 NOTE — Progress Notes (Signed)
Complete physical exam  Patient: Jocelyn Bond   DOB: 06-16-1956   67 y.o. Female  MRN: [id number]  Subjective:    Chief Complaint  Patient presents with   Annual Exam    Fasting. Also has concerns about tingling sensation in her fingers.     Jocelyn Bond is a 67 y.o. female who presents today for a complete physical exam. She reports consuming a general diet. The patient does not participate in regular exercise at present. She generally feels fairly well. She reports sleeping fairly well. She does get an occasional tingling sensation in the right fourth finger but goes away in several seconds.  She does have underlying ILD and is using Stiolto.  She has not been seen by pulmonary in over a year.  She seems to be doing okay and not having any pulmonary related symptoms.  She does have a previous history of elevated ANA but no particular diagnosis could be made in regard to this.  Her immunizations are up-to-date except for RSV.  She does have a history of aortic atherosclerosis and is taking Crestor.  She also is taking her blood pressure medications and having no difficulty with that.  She does keep yourself busy but is now involved in a regular exercise program. Otherwise her family and social history as well as health maintenance and immunizations was reviewed.  Most recent fall risk assessment:    07/14/2022    3:10 PM  [place] in the past year? 0  Number falls in past yr: 0  Injury with Fall? 0  Risk for fall due to : No Fall Risks  Follow up Falls evaluation completed     Most recent depression screenings:    07/14/2022    3:10 PM 06/30/2021   11:11 AM  PHQ 2/9 Scores  PHQ - 2 Score 0 0    Vision:Not within last year  and Dental: Receives regular dental care    Patient Care Team: Denita Lung, MD as PCP - General (Family Medicine) Jettie Booze, MD as PCP - Cardiology (Cardiology)   Outpatient Medications Prior to Visit  Medication Sig    Ginkgo Biloba 40 MG TABS Take by mouth.   glucosamine-chondroitin 500-400 MG tablet Take 1 tablet by mouth 3 (three) times daily.   [place], Centella asiatica, (GOTU KOLA PO) Take 1 capsule by mouth.   Magnesium 250 MG TABS Take by mouth.   Multiple Vitamins-Minerals (MULTIVITAMIN WITH MINERALS) tablet Take 1 tablet by mouth daily.   OVER THE COUNTER MEDICATION Lung and bronchial   OVER THE COUNTER MEDICATION optihear   STIOLTO RESPIMAT 2.5-2.5 MCG/ACT AERS INHALE 2 PUFFS BY MOUTH INTO THE LUNGS DAILY   [DISCONTINUED] hydrochlorothiazide (MICROZIDE) 12.5 MG capsule TAKE 1 CAPSULE BY MOUTH EVERY DAY   [DISCONTINUED] losartan (COZAAR) 50 MG tablet Take 1 tablet (50 mg total) by mouth daily.   [DISCONTINUED] rosuvastatin (CRESTOR) 20 MG tablet Take 1 tablet (20 mg total) by mouth daily.   No facility-administered medications prior to visit.    Review of Systems  All other systems reviewed and are negative.         Objective:     BP (!) 132/94   Pulse 76   Resp 14   Ht 5' 4.25" (1.632 m)   Wt 219 lb 9.6 oz (99.6 kg)   SpO2 98% Comment: room air  BMI 37.40 kg/m    Physical Exam   Alert and in no distress. Tympanic  membranes and canals are normal. Pharyngeal area is normal. Neck is supple without adenopathy or thyromegaly. Cardiac exam shows a regular sinus rhythm without murmurs or gallops. Lungs are clear to auscultation.  Neuroexam normal.  Pelvic exam normal.      Assessment & Plan:   Routine general medical examination at a health care facility - Plan: CBC with Differential/Platelet, Comprehensive metabolic panel, Lipid panel  Aortic atherosclerosis (HCC) - Plan: Lipid panel  Essential hypertension - Plan: losartan-hydrochlorothiazide (HYZAAR) 50-12.5 MG tablet, CBC with Differential/Platelet, Comprehensive metabolic panel  ILD (interstitial lung disease) ([place]) - Plan: Ambulatory referral to Pulmonology, CBC with Differential/Platelet, Comprehensive metabolic  panel  High frequency hearing loss, unspecified laterality  High risk medication use  Obesity (BMI 30-39.9)  Positive ANA (antinuclear antibody)  Hyperlipidemia, unspecified hyperlipidemia type - Plan: rosuvastatin (CRESTOR) 20 MG tablet, Lipid panel  Screening for cervical cancer - Plan: Cytology - PAP([place])  Screening for osteoporosis - Plan: DG Bone Density  Immunization History  Administered Date(s) Administered   Fluad Quad(high Dose 65+) 02/19/2022   Influenza,inj,Quad PF,6+ Mos 04/10/2018, 06/12/2020, 04/13/2021   Influenza-Unspecified 05/05/2016   Moderna Covid-19 Vaccine Bivalent Booster 81yr & up 02/13/2021   Moderna Sars-Covid-2 Vaccination 09/13/2019, 10/16/2019, 06/09/2020   PNEUMOCOCCAL CONJUGATE-20 06/30/2021   Pfizer Covid-19 Vaccine Bivalent Booster 19yr& up 02/19/2022   Tdap 08/06/2010, 04/13/2021   Zoster Recombinat (Shingrix) 04/10/2018, 07/11/2018    Health Maintenance  Topic Date Due   DEXA SCAN  Never done   COVID-19 Vaccine (6 - 2023-24 season) 04/16/2022   PAP SMEAR-Modifier  06/19/2022   MAMMOGRAM  03/12/2024   COLONOSCOPY (Pts [id number] coverage will need to be confirmed)  08/09/2029   DTaP/Tdap/Td (3 - Td or Tdap) 04/14/2031   Pneumonia Vaccine 65+38ears old  Completed   INFLUENZA VACCINE  Completed   Hepatitis C Screening  Completed   HIV Screening  Completed   Zoster Vaccines- Shingrix  Completed   HPV VACCINES  Aged Out    Discussed health benefits of physical activity, and encouraged her to engage in regular exercise appropriate for her age and condition.  Problem List Items Addressed This Visit     [place]ortic atherosclerosis ([place] Relevant Medications   losartan-hydrochlorothiazide (HYZAAR) 50-12.5 MG tablet   rosuvastatin (CRESTOR) 20 MG tablet   Other Relevant Orders   Lipid panel   Essential hypertension   Relevant Medications   losartan-hydrochlorothiazide (HYZAAR) 50-12.5 MG tablet   rosuvastatin  (CRESTOR) 20 MG tablet   Other Relevant Orders   CBC with Differential/Platelet   Comprehensive metabolic panel   High frequency hearing loss   High risk medication use   ILD (interstitial lung disease) ([place] Relevant Orders   Ambulatory referral to Pulmonology   CBC with Differential/Platelet   Comprehensive metabolic panel   Obesity (BMI 30-39.9)   Positive ANA (antinuclear antibody)   Other Visit Diagnoses     Routine general medical examination at a health care facility    -  Primary   Relevant Orders   CBC with Differential/Platelet   Comprehensive metabolic panel   Lipid panel   Hyperlipidemia, unspecified hyperlipidemia type       Relevant Medications   losartan-hydrochlorothiazide (HYZAAR) 50-12.5 MG tablet   rosuvastatin (CRESTOR) 20 MG tablet   Other Relevant Orders   Lipid panel   Screening for cervical cancer       Relevant Orders   Cytology - PAP([place])   Screening for osteoporosis  Relevant Orders   DG Bone Density      Return in about 2 months (around 09/12/2022). Encouraged her to become more physically active.  We will readdress her blood pressure at that point.  Also discussed dietary modification especially in regard to a DASH diet.    Jill Alexanders, MD

## 2022-07-15 LAB — COMPREHENSIVE METABOLIC PANEL
ALT: 26 IU/L (ref 0–32)
AST: 29 IU/L (ref 0–40)
Albumin/Globulin Ratio: 1 — ABNORMAL LOW (ref 1.2–2.2)
Albumin: 3.7 g/dL — ABNORMAL LOW (ref 3.9–4.9)
Alkaline Phosphatase: 74 IU/L (ref 44–121)
BUN/Creatinine Ratio: 15 (ref 12–28)
BUN: 16 mg/dL (ref 8–27)
Bilirubin Total: 0.4 mg/dL (ref 0.0–1.2)
CO2: 26 mmol/L (ref 20–29)
Calcium: 8.9 mg/dL (ref 8.7–10.3)
Chloride: 99 mmol/L (ref 96–106)
Creatinine, Ser: 1.07 mg/dL — ABNORMAL HIGH (ref 0.57–1.00)
Globulin, Total: 3.7 g/dL (ref 1.5–4.5)
Glucose: 87 mg/dL (ref 70–99)
Potassium: 4.1 mmol/L (ref 3.5–5.2)
Sodium: 138 mmol/L (ref 134–144)
Total Protein: 7.4 g/dL (ref 6.0–8.5)
eGFR: 58 mL/min/{1.73_m2} — ABNORMAL LOW (ref >59)

## 2022-07-15 LAB — CBC WITH DIFFERENTIAL/PLATELET
Basophils Absolute: 0.1 10*3/uL (ref 0.0–0.2)
Basos: 1 %
EOS (ABSOLUTE): 0.4 10*3/uL (ref 0.0–0.4)
Eos: 4 %
Hematocrit: 41.3 % (ref 34.0–46.6)
Hemoglobin: 13.3 g/dL (ref 11.1–15.9)
Immature Grans (Abs): 0 10*3/uL (ref 0.0–0.1)
Immature Granulocytes: 0 %
Lymphocytes Absolute: 2.7 10*3/uL (ref 0.7–3.1)
Lymphs: 29 %
MCH: 29.2 pg (ref 26.6–33.0)
MCHC: 32.2 g/dL (ref 31.5–35.7)
MCV: 91 fL (ref 79–97)
Monocytes Absolute: 1.6 10*3/uL — ABNORMAL HIGH (ref 0.1–0.9)
Monocytes: 17 %
Neutrophils Absolute: 4.5 10*3/uL (ref 1.4–7.0)
Neutrophils: 49 %
Platelets: 302 10*3/uL (ref 150–450)
RBC: 4.56 x10E6/uL (ref 3.77–5.28)
RDW: 13.7 % (ref 11.7–15.4)
WBC: 9.2 10*3/uL (ref 3.4–10.8)

## 2022-07-15 LAB — LIPID PANEL
Chol/HDL Ratio: 2.5 ratio (ref 0.0–4.4)
Cholesterol, Total: 160 mg/dL (ref 100–199)
HDL: 63 mg/dL (ref >39)
LDL Chol Calc (NIH): 87 mg/dL (ref 0–99)
Triglycerides: 44 mg/dL (ref 0–149)
VLDL Cholesterol Cal: 10 mg/dL (ref 5–40)

## 2022-07-19 LAB — CYTOLOGY - PAP: Diagnosis: NEGATIVE

## 2022-08-04 ENCOUNTER — Encounter: Payer: Self-pay | Admitting: Pulmonary Disease

## 2022-08-04 ENCOUNTER — Ambulatory Visit: Payer: BC Managed Care – PPO | Admitting: Pulmonary Disease

## 2022-08-04 ENCOUNTER — Ambulatory Visit (INDEPENDENT_AMBULATORY_CARE_PROVIDER_SITE_OTHER): Payer: BC Managed Care – PPO | Admitting: Pulmonary Disease

## 2022-08-04 VITALS — BP 132/76 | HR 88 | Ht 64.25 in | Wt 219.0 lb

## 2022-08-04 DIAGNOSIS — J849 Interstitial pulmonary disease, unspecified: Secondary | ICD-10-CM

## 2022-08-04 NOTE — Progress Notes (Unsigned)
Synopsis: Referred in May 2022 for cough  Subjective:   PATIENT ID: Jocelyn Bond GENDER: female DOB: [birth year], MRN: [id number]  HPI  Chief Complaint  Patient presents with   Follow-up    F/U for ILD. Denies any increased SOB or coughing.    Jocelyn Bond is a 67 year old woman, never smoker with hypertension and obesity who returns to pulmonary clinic for bronchiectasis and interstitial lung disease.   She overall continues to feel well without shortness of breath or wheezing. She does have dry cough that is stable. No joint pains or stiffness. No new skin rashes. She never went to a dermatologist for skin biopsy.  OV 02/24/21 She was initially evaluated for chronic cough and noted to have bronchiectasis and fibrotic features on her CT chest imaging predominantly at the bases.   Serologic workup showed CK 199, ANA titer 1:1,280 with a cytoplasmic/reticular pattern, weak positive Anti-PL-12 ab, Anti-SS-A ab 47, Anti-U1 RNP ab 152, and Anti-U2 RNP ab moderate positive. Serologic workup is negative for hypersensitivity pneumonitis panel. CK level is elevated at 239 on 01/29/21 and 199 on 11/28/20.   Pulmonary function tests 12/10/20 show mild restrictive defect and moderate diffusion defect.   She was evaluated by Dr. Benjamine Mola of rheumatology on 01/29/21 for concern of systemic sclerosis or polymyositis. 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 No specific anti-inflammatory treatment was recommended at this time.   She continues on stiolto with good improvement in her breathing.  Past Medical History:  Diagnosis Date   Allergy    very rare    Hypertension    ILD (interstitial lung disease) ([place])    Obesity      Family History   Problem Relation Age of Onset   Hypertension Mother    Hypertension Father    Hypertension Sister    Cancer Sister        breast ca   Hypertension Sister    Arthritis/Rheumatoid Sister    Hypertension Sister    Fibromyalgia Sister    Hypertension Sister    Healthy Son    Healthy Daughter    Healthy Daughter    Colon cancer Neg Hx    Colon polyps Neg Hx    Esophageal cancer Neg Hx    Rectal cancer Neg Hx    Stomach cancer Neg Hx      Social History   Socioeconomic History   Marital status: Divorced    Spouse name: Not on file   Number of children: Not on file   Years of education: Not on file   Highest education level: Not on file  Occupational History   Not on file  Tobacco Use   Smoking status: Never   Smokeless tobacco: Never  Vaping Use   Vaping Use: Never used  Substance and Sexual Activity   Alcohol use: Yes    Alcohol/week: 1.0 standard drink of alcohol    Types: 1 Glasses of wine per week    Comment: rare    Drug use: No   Sexual activity: Not Currently  Other Topics Concern   Not on file  Social History Narrative   Not on file   Social Determinants of Health   Financial Resource Strain: Not on file  Food Insecurity: Not on file  Transportation Needs: Not  on file  Physical Activity: Not on file  Stress: Not on file  Social Connections: Not on file  Intimate Partner Violence: Not on file     No Known Allergies   Outpatient Medications Prior to Visit  Medication Sig Dispense Refill   Ginkgo Biloba 40 MG TABS Take by mouth.     glucosamine-chondroitin 500-400 MG tablet Take 1 tablet by mouth 3 (three) times daily.     [place], Emergency planning/management officer asiatica, (GOTU KOLA PO) Take 1 capsule by mouth.     losartan-hydrochlorothiazide (HYZAAR) 50-12.5 MG tablet Take 1 tablet by mouth daily. 90 tablet 3   Magnesium 250 MG TABS Take by mouth.     Multiple Vitamins-Minerals (MULTIVITAMIN WITH MINERALS) tablet Take 1 tablet by mouth daily.     OVER THE COUNTER  MEDICATION Lung and bronchial     OVER THE COUNTER MEDICATION optihear     rosuvastatin (CRESTOR) 20 MG tablet Take 1 tablet (20 mg total) by mouth daily. 90 tablet 3   STIOLTO RESPIMAT 2.5-2.5 MCG/ACT AERS INHALE 2 PUFFS BY MOUTH INTO THE LUNGS DAILY 3 each 3   No facility-administered medications prior to visit.   Review of Systems  Constitutional:  Negative for chills, fever, malaise/fatigue and weight loss.  HENT:  Negative for congestion, sinus pain and sore throat.   Eyes: Negative.   Respiratory:  Positive for cough. Negative for hemoptysis, sputum production, shortness of breath and wheezing.   Cardiovascular:  Negative for chest pain, palpitations, orthopnea, claudication and leg swelling.  Gastrointestinal:  Negative for abdominal pain, heartburn, nausea and vomiting.  Genitourinary: Negative.   Musculoskeletal:  Negative for joint pain and myalgias.  Skin:  Negative for rash.  Neurological:  Negative for weakness.  Endo/Heme/Allergies: Negative.   Psychiatric/Behavioral: Negative.     Objective:   Vitals:   08/04/22 1001  BP: 132/76  Pulse: 88  SpO2: 98%  Weight: 219 lb (99.3 kg)  Height: 5' 4.25" (1.632 m)   Physical Exam Constitutional:      General: She is not in acute distress.    Appearance: She is not ill-appearing.  HENT:     Head: Normocephalic and atraumatic.  Eyes:     General: No scleral icterus.    Conjunctiva/sclera: Conjunctivae normal.  Cardiovascular:     Rate and Rhythm: Normal rate and regular rhythm.     Pulses: Normal pulses.     Heart sounds: Normal heart sounds. No murmur heard. Pulmonary:     Effort: Pulmonary effort is normal.     Breath sounds: Rales (bibasilar bases) present. No wheezing or rhonchi.  Musculoskeletal:        General: No swelling or tenderness.     Right lower leg: No edema.     Left lower leg: No edema.  Skin:    General: Skin is warm and dry.     Capillary Refill: Capillary refill takes less than 2 seconds.      Findings: No lesion or rash.  Neurological:     General: No focal deficit present.     Mental Status: She is alert.     CBC    Component Value Date/Time   WBC 9.2 07/14/2022 1555   WBC 7.7 12/19/2015 0825   RBC 4.56 07/14/2022 1555   RBC 4.44 12/19/2015 0825   HGB 13.3 07/14/2022 1555   HCT 41.3 07/14/2022 1555   PLT 302 07/14/2022 1555   MCV 91 07/14/2022 1555   MCH 29.2 07/14/2022 1555   MCH  28.6 12/19/2015 0825   MCHC 32.2 07/14/2022 1555   MCHC 32.4 12/19/2015 0825   RDW 13.7 07/14/2022 1555   LYMPHSABS 2.7 07/14/2022 1555   MONOABS 1,078 (H) 12/19/2015 0825   EOSABS 0.4 07/14/2022 1555   BASOSABS 0.1 07/14/2022 1555      Latest Ref Rng & Units 07/14/2022    3:55 PM 06/30/2021    1:31 PM 06/23/2020    2:53 PM  BMP  Glucose 70 - 99 mg/dL 87  84  84   BUN 8 - 27 mg/dL '16  13  11   '$ Creatinine 0.57 - 1.00 mg/dL 1.07  0.98  1.11   BUN/Creat Ratio 12 - '28 15  13  10   '$ Sodium 134 - 144 mmol/L 138  142  141   Potassium 3.5 - 5.2 mmol/L 4.1  4.1  3.7   Chloride 96 - 106 mmol/L 99  103  101   CO2 20 - 29 mmol/L '26  29  25   '$ Calcium 8.7 - 10.3 mg/dL 8.9  9.1  8.9    Chest imaging: CT Chest 10/23/20 Mediastinum/Nodes: Mediastinal and hilar lymph nodes are not enlarged by CT size criteria. No axillary adenopathy. Esophagus is grossly unremarkable.   Lungs/Pleura: Peripheral and basilar predominant subpleural reticulation, ground-glass and traction bronchiectasis/bronchiolectasis. Postoperative changes in the right hemithorax. No pleural fluid. Airway is unremarkable.  PFT:    Latest Ref Rng & Units 12/10/2020    3:56 PM  PFT Results  FVC-Pre L 2.37   FVC-Predicted Pre % 89   FVC-Post L 2.41   FVC-Predicted Post % 90   Pre FEV1/FVC % % 89   Post FEV1/FCV % % 90   FEV1-Pre L 2.12   FEV1-Predicted Pre % 101   FEV1-Post L 2.17   DLCO uncorrected ml/min/mmHg 11.50   DLCO UNC% % 55   DLCO corrected ml/min/mmHg 11.50   DLCO COR %Predicted % 55   DLVA Predicted %  88   TLC L 3.76   TLC % Predicted % 72   RV % Predicted % 64   PFT - Mild restriction and moderate diffusion defect  Path: 2009:  1,2,3. LUNG, RIGHT LOWER LOBE AND RIGHT MIDDLE LOBE OPEN,   BIOPSIES:   - BRONCHIOLITIS OBLITERANS ORGANIZING PNEUMONIA (BOOP)    Assessment & Plan:   ILD (interstitial lung disease) ([place]) - Plan: CT CHEST HIGH RESOLUTION, Pulmonary Function Test  Discussion: Jocelyn Bond is a 67 year old woman, never smoker with hypertension, obesity and bronchiolitis obliterans organizing pneumonia in 2009 who returns to pulmonary clinic for bronchiectasis and interstitial lung disease.   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 She was seen by Rheumatology on 01/29/21 with no recommendations for systemic therapy at that time.   Recommend she follow up with Dr. Benjamine Mola of Rheumatology to determine if repeat lab testing is necessary and if she does need to see dermatology for skin biopsy. Clinically she appears to be doing well.   We will repeat HRCT Chest and PFTs to monitor her lung disease.  Follow up in 1 months  Freda Jackson, MD [facility] Office: [phone]    Current Outpatient Medications:    Ginkgo Biloba 40 MG TABS, Take by mouth., Disp: , Rfl:    glucosamine-chondroitin 500-400 MG tablet, Take 1 tablet by mouth  3 (three) times daily., Disp: , Rfl:    [place], Centella asiatica, (GOTU KOLA PO), Take 1 capsule by mouth., Disp: , Rfl:    losartan-hydrochlorothiazide (HYZAAR) 50-12.5 MG tablet, Take 1 tablet by mouth daily., Disp: 90 tablet, Rfl: 3   Magnesium 250 MG TABS, Take by mouth., Disp: , Rfl:    Multiple Vitamins-Minerals (MULTIVITAMIN WITH MINERALS) tablet, Take 1 tablet by mouth daily., Disp: , Rfl:     OVER THE COUNTER MEDICATION, Lung and bronchial, Disp: , Rfl:    OVER THE COUNTER MEDICATION, optihear, Disp: , Rfl:    rosuvastatin (CRESTOR) 20 MG tablet, Take 1 tablet (20 mg total) by mouth daily., Disp: 90 tablet, Rfl: 3   STIOLTO RESPIMAT 2.5-2.5 MCG/ACT AERS, INHALE 2 PUFFS BY MOUTH INTO THE LUNGS DAILY, Disp: 3 each, Rfl: 3

## 2022-08-04 NOTE — Patient Instructions (Addendum)
We will schedule you for a CT Chest scan and pulmonary function tests in the next week or two  Recommend following back up with Dr. Benjamine Mola of Rheumatology to consider repeating labs and determining need for Dermatology evaluation.   Follow up in 1 month

## 2022-08-05 ENCOUNTER — Encounter: Payer: Self-pay | Admitting: Pulmonary Disease

## 2022-09-02 ENCOUNTER — Ambulatory Visit
Admission: RE | Admit: 2022-09-02 | Discharge: 2022-09-02 | Disposition: A | Discharge status: Home / Self Care | Payer: BC Managed Care – PPO | Source: Ambulatory Visit | Attending: Pulmonary Disease | Admitting: Pulmonary Disease

## 2022-09-02 DIAGNOSIS — J849 Interstitial pulmonary disease, unspecified: Secondary | ICD-10-CM

## 2022-09-09 ENCOUNTER — Encounter: Payer: Self-pay | Admitting: Pulmonary Disease

## 2022-09-09 ENCOUNTER — Ambulatory Visit (INDEPENDENT_AMBULATORY_CARE_PROVIDER_SITE_OTHER): Payer: BC Managed Care – PPO | Admitting: Pulmonary Disease

## 2022-09-09 VITALS — BP 128/86 | HR 88 | Ht 64.25 in | Wt 214.0 lb

## 2022-09-09 DIAGNOSIS — J849 Interstitial pulmonary disease, unspecified: Secondary | ICD-10-CM | POA: Diagnosis not present

## 2022-09-09 NOTE — Patient Instructions (Addendum)
We will have our office get in touch with Dr. Marveen Reeks office to get you an appointment for follow up.  You CT Chest scan shows mild progression of scarring on the lungs.  Once we have final diagnosis from rheumatology we will consider antiinflammatory and anti-fibrotic therapy for your lungs to prevent progression of disease.   Follow up in 2 months with pulmonary function tests

## 2022-09-09 NOTE — Progress Notes (Signed)
Synopsis: Referred in May 2022 for cough  Subjective:   PATIENT ID: Jocelyn Bond GENDER: female DOB: 09-17-1955, MRN: [id number]  HPI  Chief Complaint  Patient presents with   Follow-up    F/U after CT scan. States she has been doing well since last visit.    Jocelyn Bond is a 67 year old woman, never smoker with hypertension and obesity who returns to pulmonary clinic for bronchiectasis and interstitial lung disease.   HRCT Chest shows peripheral and basilar predominant traction bronchiectasis/bronchiolectasis, coarsened ground-glass and subpleural reticular densities. No definitive honeycombing. Very minimally progressive since 10/23/20.   PFTs not completed since last visit.  OV 08/04/22 She overall continues to feel well without shortness of breath or wheezing. She does have dry cough that is stable. No joint pains or stiffness. No new skin rashes. She never went to a dermatologist for skin biopsy.  OV 02/24/21 She was initially evaluated for chronic cough and noted to have bronchiectasis and fibrotic features on her CT chest imaging predominantly at the bases.   Serologic workup showed CK 199, ANA titer 1:1,280 with a cytoplasmic/reticular pattern, weak positive Anti-PL-12 ab, Anti-SS-A ab 47, Anti-U1 RNP ab 152, and Anti-U2 RNP ab moderate positive. Serologic workup is negative for hypersensitivity pneumonitis panel. CK level is elevated at 239 on 01/29/21 and 199 on 11/28/20.   Pulmonary function tests 12/10/20 show mild restrictive defect and moderate diffusion defect.   She was evaluated by Dr. Benjamine Mola of rheumatology on 01/29/21 for concern of systemic sclerosis or polymyositis. 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 No specific  anti-inflammatory treatment was recommended at this time.   She continues on stiolto with good improvement in her breathing.  Past Medical History:  Diagnosis Date   Allergy    very rare    Hypertension    ILD (interstitial lung disease) ([place])    Obesity      Family History  Problem Relation Age of Onset   Hypertension Mother    Hypertension Father    Hypertension Sister    Cancer Sister        breast ca   Hypertension Sister    Arthritis/Rheumatoid Sister    Hypertension Sister    Fibromyalgia Sister    Hypertension Sister    Healthy Son    Healthy Daughter    Healthy Daughter    Colon cancer Neg Hx    Colon polyps Neg Hx    Esophageal cancer Neg Hx    Rectal cancer Neg Hx    Stomach cancer Neg Hx      Social History   Socioeconomic History   Marital status: Single    Spouse name: Not on file   Number of children: Not on file   Years of education: Not on file   Highest education level: Not on file  Occupational History   Not on file  Tobacco Use   Smoking status: Never   Smokeless tobacco: Never  Vaping Use   Vaping Use: Never used  Substance and Sexual Activity   Alcohol use: Yes    Alcohol/week: 1.0 standard drink of alcohol    Types: 1 Glasses of wine per week    Comment: rare    Drug use: No   Sexual activity: Not Currently  Other Topics Concern  Not on file  Social History Narrative   Not on file   Social Determinants of Health   Financial Resource Strain: Not on file  Food Insecurity: Not on file  Transportation Needs: Not on file  Physical Activity: Not on file  Stress: Not on file  Social Connections: Not on file  Intimate Partner Violence: Not on file     No Known Allergies   Outpatient Medications Prior to Visit  Medication Sig Dispense Refill   Ginkgo Biloba 40 MG TABS Take by mouth.     glucosamine-chondroitin 500-400 MG tablet Take 1 tablet by mouth 3 (three) times daily.     [place], Emergency planning/management officer asiatica, (GOTU KOLA PO)  Take 1 capsule by mouth.     losartan-hydrochlorothiazide (HYZAAR) 50-12.5 MG tablet Take 1 tablet by mouth daily. 90 tablet 3   Magnesium 250 MG TABS Take by mouth.     Multiple Vitamins-Minerals (MULTIVITAMIN WITH MINERALS) tablet Take 1 tablet by mouth daily.     OVER THE COUNTER MEDICATION Lung and bronchial     OVER THE COUNTER MEDICATION optihear     rosuvastatin (CRESTOR) 20 MG tablet Take 1 tablet (20 mg total) by mouth daily. 90 tablet 3   STIOLTO RESPIMAT 2.5-2.5 MCG/ACT AERS INHALE 2 PUFFS BY MOUTH INTO THE LUNGS DAILY 3 each 3   No facility-administered medications prior to visit.   Review of Systems  Constitutional:  Negative for chills, fever, malaise/fatigue and weight loss.  HENT:  Negative for congestion, sinus pain and sore throat.   Eyes: Negative.   Respiratory:  Positive for cough. Negative for hemoptysis, sputum production, shortness of breath and wheezing.   Cardiovascular:  Negative for chest pain, palpitations, orthopnea, claudication and leg swelling.  Gastrointestinal:  Negative for abdominal pain, heartburn, nausea and vomiting.  Genitourinary: Negative.   Musculoskeletal:  Negative for joint pain and myalgias.  Skin:  Negative for rash.  Neurological:  Negative for weakness.  Endo/Heme/Allergies: Negative.   Psychiatric/Behavioral: Negative.     Objective:   Vitals:   09/09/22 0949  BP: 128/86  Pulse: 88  SpO2: 99%  Weight: 214 lb (97.1 kg)  Height: 5' 4.25" (1.632 m)   Physical Exam Constitutional:      General: She is not in acute distress.    Appearance: She is not ill-appearing.  HENT:     Head: Normocephalic and atraumatic.  Eyes:     General: No scleral icterus.    Conjunctiva/sclera: Conjunctivae normal.  Cardiovascular:     Rate and Rhythm: Normal rate and regular rhythm.     Pulses: Normal pulses.     Heart sounds: Normal heart sounds. No murmur heard. Pulmonary:     Effort: Pulmonary effort is normal.     Breath sounds: Rales  (bibasilar bases) present. No wheezing or rhonchi.  Musculoskeletal:        General: No swelling or tenderness.     Right lower leg: No edema.     Left lower leg: No edema.  Skin:    General: Skin is warm and dry.     Capillary Refill: Capillary refill takes less than 2 seconds.     Findings: No lesion or rash.  Neurological:     General: No focal deficit present.     Mental Status: She is alert.     CBC    Component Value Date/Time   WBC 9.2 07/14/2022 1555   WBC 7.7 12/19/2015 0825   RBC 4.56 07/14/2022 1555   RBC  4.44 12/19/2015 0825   HGB 13.3 07/14/2022 1555   HCT 41.3 07/14/2022 1555   PLT 302 07/14/2022 1555   MCV 91 07/14/2022 1555   MCH 29.2 07/14/2022 1555   MCH 28.6 12/19/2015 0825   MCHC 32.2 07/14/2022 1555   MCHC 32.4 12/19/2015 0825   RDW 13.7 07/14/2022 1555   LYMPHSABS 2.7 07/14/2022 1555   MONOABS 1,078 (H) 12/19/2015 0825   EOSABS 0.4 07/14/2022 1555   BASOSABS 0.1 07/14/2022 1555      Latest Ref Rng & Units 07/14/2022    3:55 PM 06/30/2021    1:31 PM 06/23/2020    2:53 PM  BMP  Glucose 70 - 99 mg/dL 87  84  84   BUN 8 - 27 mg/dL '16  13  11   '$ Creatinine 0.57 - 1.00 mg/dL 1.07  0.98  1.11   BUN/Creat Ratio 12 - '28 15  13  10   '$ Sodium 134 - 144 mmol/L 138  142  141   Potassium 3.5 - 5.2 mmol/L 4.1  4.1  3.7   Chloride 96 - 106 mmol/L 99  103  101   CO2 20 - 29 mmol/L '26  29  25   '$ Calcium 8.7 - 10.3 mg/dL 8.9  9.1  8.9    Chest imaging: HRCT Chest 09/02/22 1. Pulmonary parenchymal pattern of fibrosis appears very minimally progressive from 10/23/2020. Findings are categorized as probable UIP per consensus guidelines: Diagnosis of Idiopathic Pulmonary Fibrosis: An Official ATS/ERS/JRS/ALAT Clinical Practice Guideline. [place], Iss 5, [phone], Mar 05 2017. 2. Tiny right renal stones. 3.  Aortic atherosclerosis (ICD10-I70.0). 4. Enlarged pulmonic trunk, indicative of pulmonary arterial hypertension.  CT Chest  10/23/20 Mediastinum/Nodes: Mediastinal and hilar lymph nodes are not enlarged by CT size criteria. No axillary adenopathy. Esophagus is grossly unremarkable.   Lungs/Pleura: Peripheral and basilar predominant subpleural reticulation, ground-glass and traction bronchiectasis/bronchiolectasis. Postoperative changes in the right hemithorax. No pleural fluid. Airway is unremarkable.  PFT:    Latest Ref Rng & Units 12/10/2020    3:56 PM  PFT Results  FVC-Pre L 2.37   FVC-Predicted Pre % 89   FVC-Post L 2.41   FVC-Predicted Post % 90   Pre FEV1/FVC % % 89   Post FEV1/FCV % % 90   FEV1-Pre L 2.12   FEV1-Predicted Pre % 101   FEV1-Post L 2.17   DLCO uncorrected ml/min/mmHg 11.50   DLCO UNC% % 55   DLCO corrected ml/min/mmHg 11.50   DLCO COR %Predicted % 55   DLVA Predicted % 88   TLC L 3.76   TLC % Predicted % 72   RV % Predicted % 64   PFT - Mild restriction and moderate diffusion defect  Path: 2009:  1,2,3. LUNG, RIGHT LOWER LOBE AND RIGHT MIDDLE LOBE OPEN,   BIOPSIES:   - BRONCHIOLITIS OBLITERANS ORGANIZING PNEUMONIA (BOOP)    Assessment & Plan:   ILD (interstitial lung disease) ([place])  Discussion: Kealia Mccuen is a 67 year old woman, never smoker with hypertension, obesity and bronchiolitis obliterans organizing pneumonia in 2009 who returns to pulmonary clinic for bronchiectasis and interstitial lung disease.   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 She was seen by Rheumatology on 01/29/21 with referral to dermatology to consider skin biopsy to evaluate for dermatomyositis.  She has follow up scheduled with rheumatology and she will need referral to dermatology for further evaluation.   We will schedule her for pulmonary function tests. We will review  her case again in ILD conference and consider cellcept therapy and then addition of antifibrotic therapy.   Follow up in 2 months  Freda Jackson, MD [facility] Office: [phone]    Current Outpatient Medications:    Ginkgo Biloba 40 MG TABS, Take by mouth., Disp: , Rfl:    glucosamine-chondroitin 500-400 MG tablet, Take 1 tablet by mouth 3 (three) times daily., Disp: , Rfl:    [place], Centella asiatica, (GOTU KOLA PO), Take 1 capsule by mouth., Disp: , Rfl:    losartan-hydrochlorothiazide (HYZAAR) 50-12.5 MG tablet, Take 1 tablet by mouth daily., Disp: 90 tablet, Rfl: 3   Magnesium 250 MG TABS, Take by mouth., Disp: , Rfl:    Multiple Vitamins-Minerals (MULTIVITAMIN WITH MINERALS) tablet, Take 1 tablet by mouth daily., Disp: , Rfl:    OVER THE COUNTER MEDICATION, Lung and bronchial, Disp: , Rfl:    OVER THE COUNTER MEDICATION, optihear, Disp: , Rfl:    rosuvastatin (CRESTOR) 20 MG tablet, Take 1 tablet (20 mg total) by mouth daily., Disp: 90 tablet, Rfl: 3   STIOLTO RESPIMAT 2.5-2.5 MCG/ACT AERS, INHALE 2 PUFFS BY MOUTH INTO THE LUNGS DAILY, Disp: 3 each, Rfl: 3

## 2022-09-12 ENCOUNTER — Encounter: Payer: Self-pay | Admitting: Pulmonary Disease

## 2022-09-14 ENCOUNTER — Ambulatory Visit: Payer: Medicare Other | Admitting: Family Medicine

## 2022-09-20 NOTE — Progress Notes (Signed)
Office Visit Note  Patient: Jocelyn Bond             Date of Birth: 1955-08-28           MRN: [id number]             PCP: Denita Lung, MD Referring: Denita Lung, MD Visit Date: 09/21/2022   Subjective:   History of Present Illness: Jocelyn Bond is a 67 y.o. female here for follow up for systemic inflammatory symptoms and abnormal labs associated with interstitial lung disease.  It has been about 2 years since last follow-up she is doing well so did not pursue much additional workup or the recommended skin biopsy.  Repeat lung CT chest shows mild progression compared to 2022 imaging so was recommended probably needing to start treatment for her inflammatory lung disease.  She continues having mild amount of joint pain and stiffness in several areas but not seeing a lot of visible swelling and not taking any medications for this.  Has not been experiencing any worsening trouble with acid reflux or dysphagia.  She still having issues of sometimes violaceous sometimes erythematous discoloration in her distal fingers with cold exposure.  Not associated with any nail changes or skin lesions.  She had an episode of rash across the back of her neck that resolved mostly on its own did use some over-the-counter topical cream.  Previous HPI 01/29/21 Jocelyn Bond is a 66 y.o. female here for evaluation for systemic connective tissue disease with positive antibodies and diagnosis of ILD.  She does seem to have a degree of chronic nonproductive cough but denies significant shortness of breath with exertion or at rest.  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  Current coughing has been noticeable since about 1 year ago.  She denies any particular complaints with joint pains or swelling.  She experiences some chronic rash on her distal  arms and hands bilaterally that is controlled with topical treatment.  She experiences purple discoloration in the tips of the third finger of each hand with cold exposure that improved after rewarming.  She reports occasional choking on solid foods and having to regurgitate but this is infrequent.  She denies symptomatic acid reflux.  She experiences swelling of the right foot chronically states previous studies have indicated venous insufficiency around the level of the ankle without a particularly identified cause.  She denies any history of blood clots.  She also denies alopecia, oral ulcers, or photosensitive skin rashes.   Labs reviewed 11/2020 ANA 1:1280 cytoplasmic/reticular 1:1280 speckled Scl-70, SSA, SSb neg RF 40 CCP neg MyoMarker 3 Pl-12 weak pos, Ku weak pos, SSA 52kD 47, U1RNP 152, U2RNP moderate pos ANCA neg CK 199   Review of Systems  Constitutional:  Negative for fatigue.  HENT:  Positive for mouth dryness. Negative for mouth sores.   Eyes:  Negative for dryness.  Respiratory:  Negative for shortness of breath.   Cardiovascular:  Negative for chest pain and palpitations.  Gastrointestinal:  Negative for blood in stool, constipation and diarrhea.  Endocrine: Negative for increased urination.  Genitourinary:  Negative for involuntary urination.  Musculoskeletal:  Negative for joint pain, gait problem, joint pain, joint swelling, myalgias, muscle weakness, morning stiffness, muscle tenderness and myalgias.  Skin:  Negative for color change, rash, hair loss and sensitivity to sunlight.  Allergic/Immunologic: Negative for susceptible to infections.  Neurological:  Negative for dizziness and headaches.  Hematological:  Negative for swollen glands.  Psychiatric/Behavioral:  Negative for depressed mood and sleep disturbance. The patient is not nervous/anxious.     PMFS History:  Patient Active Problem List   Diagnosis Date Noted   High frequency hearing loss 06/30/2021    Aortic atherosclerosis 06/30/2021   Positive ANA (antinuclear antibody) 01/29/2021   ILD (interstitial lung disease) 01/29/2021   High risk medication use 01/29/2021   Seborrheic keratosis 06/20/2019   Lymphadenopathy of head and neck 02/16/2016   Chronic venous insufficiency 12/11/2012   Obesity (BMI 30-39.9) 12/11/2012   Essential hypertension 01/09/2008    Past Medical History:  Diagnosis Date   Allergy    very rare    Hypertension    ILD (interstitial lung disease) ([place])    Obesity     Family History  Problem Relation Age of Onset   Hypertension Mother    Hypertension Father    Hypertension Sister    Cancer Sister        breast ca   Hypertension Sister    Arthritis/Rheumatoid Sister    Hypertension Sister    Fibromyalgia Sister    Hypertension Sister    Healthy Son    Healthy Daughter    Healthy Daughter    Colon cancer Neg Hx    Colon polyps Neg Hx    Esophageal cancer Neg Hx    Rectal cancer Neg Hx    Stomach cancer Neg Hx    Past Surgical History:  Procedure Laterality Date   BTL     BUNIONECTOMY     COLONOSCOPY  2009   13 yrs ago [facility]- pt states removed polyps , no report, no path    LUNG BIOPSY  2009   TUBAL LIGATION     Social History   Social History Narrative   Not on file   Immunization History  Administered Date(s) Administered   Fluad Quad(high Dose 65+) 02/19/2022   Influenza,inj,Quad PF,6+ Mos 04/10/2018, 06/12/2020, 04/13/2021   Influenza-Unspecified 05/05/2016   Moderna Covid-19 Vaccine Bivalent Booster 6yrs & up 02/13/2021   Moderna Sars-Covid-2 Vaccination 09/13/2019, 10/16/2019, 06/09/2020   PNEUMOCOCCAL CONJUGATE-20 06/30/2021   Respiratory Syncytial Virus Vaccine,Recomb Aduvanted(Arexvy) 07/16/2022   Tdap 08/06/2010, 04/13/2021   Zoster Recombinat (Shingrix) 04/10/2018, 07/11/2018     Objective: Vital Signs: BP (!) 135/95 (BP Location: Left Arm, Patient Position: Sitting, Cuff Size: Normal)   Pulse 73   Resp 16   Ht 5'  4.5" (1.638 m)   Wt 216 lb (98 kg)   BMI 36.50 kg/m    Physical Exam Constitutional:      Appearance: She is obese.  HENT:     Mouth/Throat:     Mouth: Mucous membranes are moist.     Pharynx: Oropharynx is clear.  Eyes:     Conjunctiva/sclera: Conjunctivae normal.  Cardiovascular:     Rate and Rhythm: Normal rate and regular rhythm.  Pulmonary:     Effort: Pulmonary effort is normal.     Breath sounds: Normal breath sounds.  Lymphadenopathy:     Cervical: No cervical adenopathy.  Skin:    General: Skin is warm and dry.     Comments: Few patchy hyperpigmented areas on forearm and hand, no papules, no facial rash  Neurological:     Mental Status: She is alert.  Psychiatric:        Mood and Affect: Mood normal.      Musculoskeletal Exam:  Shoulders full ROM no  tenderness or swelling Elbows full ROM no tenderness or swelling Wrists full ROM no tenderness or swelling Fingers full ROM no tenderness or swelling, bony nodule of right 1st IP joint Knees full ROM no tenderness or swelling Ankles full ROM no tenderness or swelling   Investigation: No additional findings.  Imaging: No results found.  Recent Labs: Lab Results  Component Value Date   WBC 9.2 07/14/2022   HGB 13.3 07/14/2022   PLT 302 07/14/2022   NA 138 07/14/2022   K 4.1 07/14/2022   CL 99 07/14/2022   CO2 26 07/14/2022   GLUCOSE 87 07/14/2022   BUN 16 07/14/2022   CREATININE 1.07 (H) 07/14/2022   BILITOT 0.4 07/14/2022   ALKPHOS 74 07/14/2022   AST 29 07/14/2022   ALT 26 07/14/2022   PROT 7.4 07/14/2022   ALBUMIN 3.7 (L) 07/14/2022   CALCIUM 8.9 07/14/2022   GFRAA 61 06/23/2020   QFTBGOLDPLUS NEGATIVE 01/29/2021    Speciality Comments: No specialty comments available.  Procedures:  No procedures performed Allergies: Patient has no known allergies.   Assessment / Plan:     Visit Diagnoses: Positive ANA (antinuclear antibody) - Plan: Sedimentation rate, C-reactive protein, CK, C3 and  C4  Clinically not too much evidence of musculoskeletal or skin inflammation involvement at this time.  Does have digital discoloration consistent with Raynaud's but without digital ischemic changes or lesions.  Checking sed rate CRP CK and serum complements for evidence of systemic inflammatory activity.  Still believe she would benefit with dermatology consultation for evaluation and possible biopsy to try and confirm cutaneous inflammatory disease.  ILD (interstitial lung disease) ([place])  Appears to have some mild progression on subsequent imaging though symptoms are minimal overall.  No particular cough or dyspnea complaints at this time.  Has planned follow-up with Dr. Erin Fulling, per her last note reviewed consideration of starting CellCept and/or antifibrotic therapy for progressive ILD.  Orders: Orders Placed This Encounter  Procedures   Sedimentation rate   C-reactive protein   CK   C3 and C4   No orders of the defined types were placed in this encounter.    Follow-Up Instructions: Return in about 3 months (around 12/22/2022), or if symptoms worsen or fail to improve, for MCTD/myositis? w/ ILD f/u 71mos.   Collier Salina, MD  Note - This record has been created using [employer].  Chart creation errors have been sought, but may not always  have been located. Such creation errors do not reflect on  the standard of medical care.

## 2022-09-21 ENCOUNTER — Encounter: Payer: Self-pay | Admitting: Internal Medicine

## 2022-09-21 ENCOUNTER — Ambulatory Visit: Payer: BC Managed Care – PPO | Attending: Internal Medicine | Admitting: Internal Medicine

## 2022-09-21 VITALS — BP 135/95 | HR 73 | Resp 16 | Ht 64.5 in | Wt 216.0 lb

## 2022-09-21 DIAGNOSIS — J849 Interstitial pulmonary disease, unspecified: Secondary | ICD-10-CM

## 2022-09-21 DIAGNOSIS — R768 Other specified abnormal immunological findings in serum: Secondary | ICD-10-CM

## 2022-09-21 DIAGNOSIS — Z79899 Other long term (current) drug therapy: Secondary | ICD-10-CM

## 2022-09-22 LAB — SEDIMENTATION RATE: Sed Rate: 28 mm/h (ref 0–30)

## 2022-09-22 LAB — C-REACTIVE PROTEIN: CRP: 3.4 mg/L (ref <8.0)

## 2022-09-22 LAB — C3 AND C4
C3 Complement: 116 mg/dL (ref 83–193)
C4 Complement: 26 mg/dL (ref 15–57)

## 2022-09-22 LAB — CK: Total CK: 291 U/L — ABNORMAL HIGH (ref 29–143)

## 2022-09-22 NOTE — Progress Notes (Signed)
CK level is mildly increased at 291 this is up from 239 and 199  in 2022. This number is not very high but is also more than normal and I think would be good to monitor if this improves with starting medication for the ILD. Other lab markers were all normal no additional evidence of systemic inflammation.

## 2022-12-22 NOTE — Progress Notes (Unsigned)
Office Visit Note  Patient: Jocelyn Bond             Date of Birth: [birth year]           MRN: [id number]             PCP: Ronnald Nian, MD Referring: Ronnald Nian, MD Visit Date: 12/23/2022   Subjective:  No chief complaint on file.   History of Present Illness: Jocelyn Bond is a 67 y.o. female here for follow up ***   Previous HPI 09/21/22 Jocelyn Bond is a 67 y.o. female here for follow up for systemic inflammatory symptoms and abnormal labs associated with interstitial lung disease.  It has been about 2 years since last follow-up she is doing well so did not pursue much additional workup or the recommended skin biopsy.  Repeat lung CT chest shows mild progression compared to 2022 imaging so was recommended probably needing to start treatment for her inflammatory lung disease.  She continues having mild amount of joint pain and stiffness in several areas but not seeing a lot of visible swelling and not taking any medications for this.  Has not been experiencing any worsening trouble with acid reflux or dysphagia.  She still having issues of sometimes violaceous sometimes erythematous discoloration in her distal fingers with cold exposure.  Not associated with any nail changes or skin lesions.  She had an episode of rash across the back of her neck that resolved mostly on its own did use some over-the-counter topical cream.   Previous HPI 01/29/21 Jocelyn Bond is a 67 y.o. female here for evaluation for systemic connective tissue disease with positive antibodies and diagnosis of ILD.  She does seem to have a degree of chronic nonproductive cough but denies significant shortness of breath with exertion or at rest.  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  Current coughing has been noticeable since about 1 year  ago.  She denies any particular complaints with joint pains or swelling.  She experiences some chronic rash on her distal arms and hands bilaterally that is controlled with topical treatment.  She experiences purple discoloration in the tips of the third finger of each hand with cold exposure that improved after rewarming.  She reports occasional choking on solid foods and having to regurgitate but this is infrequent.  She denies symptomatic acid reflux.  She experiences swelling of the right foot chronically states previous studies have indicated venous insufficiency around the level of the ankle without a particularly identified cause.  She denies any history of blood clots.  She also denies alopecia, oral ulcers, or photosensitive skin rashes.   Labs reviewed 11/2020 ANA 1:1280 cytoplasmic/reticular 1:1280 speckled Scl-70, SSA, SSb neg RF 40 CCP neg MyoMarker 3 Pl-12 weak pos, Ku weak pos, SSA 52kD 47, U1RNP 152, U2RNP moderate pos ANCA neg CK 199   No Rheumatology ROS completed.   PMFS History:  Patient Active Problem List   Diagnosis Date Noted   High frequency hearing loss 06/30/2021   Aortic atherosclerosis (HCC) 06/30/2021   Positive ANA (antinuclear antibody) 01/29/2021   ILD (interstitial lung disease) (HCC) 01/29/2021   High risk medication use 01/29/2021   Seborrheic keratosis 06/20/2019   Lymphadenopathy of head and neck 02/16/2016   Chronic venous insufficiency 12/11/2012   Obesity (BMI 30-39.9) 12/11/2012   Essential hypertension 01/09/2008    Past Medical History:  Diagnosis Date   Allergy    very rare    Hypertension    ILD (interstitial lung disease) (HCC)    Obesity     Family History  Problem Relation Age of Onset   Hypertension Mother    Hypertension Father    Hypertension Sister    Cancer Sister        breast ca   Hypertension Sister    Arthritis/Rheumatoid Sister    Hypertension Sister    Fibromyalgia Sister    Hypertension Sister    Healthy  Son    Healthy Daughter    Healthy Daughter    Colon cancer Neg Hx    Colon polyps Neg Hx    Esophageal cancer Neg Hx    Rectal cancer Neg Hx    Stomach cancer Neg Hx    Past Surgical History:  Procedure Laterality Date   BTL     BUNIONECTOMY     COLONOSCOPY  2009   13 yrs ago Deboraha Sprang- pt states removed polyps , no report, no path    LUNG BIOPSY  2009   TUBAL LIGATION     Social History   Social History Narrative   Not on file   Immunization History  Administered Date(s) Administered   Fluad Quad(high Dose 65+) 02/19/2022   Influenza,inj,Quad PF,6+ Mos 04/10/2018, 06/12/2020, 04/13/2021   Influenza-Unspecified 05/05/2016   Moderna Covid-19 Vaccine Bivalent Booster 35yrs & up 02/13/2021   Moderna Sars-Covid-2 Vaccination 09/13/2019, 10/16/2019, 06/09/2020   PNEUMOCOCCAL CONJUGATE-20 06/30/2021   Respiratory Syncytial Virus Vaccine,Recomb Aduvanted(Arexvy) 07/16/2022   Tdap 08/06/2010, 04/13/2021   Zoster Recombinat (Shingrix) 04/10/2018, 07/11/2018     Objective: Vital Signs: There were no vitals taken for this visit.   Physical Exam   Musculoskeletal Exam: ***  CDAI Exam: CDAI Score: -- Patient Global: --; Provider Global: -- Swollen: --; Tender: -- Joint Exam 12/23/2022   No joint exam has been documented for this visit   There is currently no information documented on the homunculus. Go to the Rheumatology activity and complete the homunculus joint exam.  Investigation: No additional findings.  Imaging: No results found.  Recent Labs: Lab Results  Component Value Date   WBC 9.2 07/14/2022   HGB 13.3 07/14/2022   PLT 302 07/14/2022   NA 138 07/14/2022   K 4.1 07/14/2022   CL 99 07/14/2022   CO2 26 07/14/2022   GLUCOSE 87 07/14/2022   BUN 16 07/14/2022   CREATININE 1.07 (H) 07/14/2022   BILITOT 0.4 07/14/2022   ALKPHOS 74 07/14/2022   AST 29 07/14/2022   ALT 26 07/14/2022   PROT 7.4 07/14/2022   ALBUMIN 3.7 (L) 07/14/2022   CALCIUM 8.9  07/14/2022   GFRAA 61 06/23/2020   QFTBGOLDPLUS NEGATIVE 01/29/2021    Speciality Comments: No specialty comments available.  Procedures:  No procedures performed Allergies: Patient has no known allergies.   Assessment / Plan:     Visit Diagnoses: No diagnosis found.  ***  Orders: No orders of the defined types were placed in this encounter.  No orders of the defined types were placed in this encounter.    Follow-Up Instructions: No follow-ups on file.   Fuller Plan, MD  Note - This record has been created using [employer].  Chart creation errors have been sought, but may not always  have been located. Such creation errors do not reflect on  the standard of medical care.

## 2022-12-23 ENCOUNTER — Ambulatory Visit: Payer: BC Managed Care – PPO | Attending: Internal Medicine | Admitting: Internal Medicine

## 2022-12-23 ENCOUNTER — Encounter: Payer: Self-pay | Admitting: Internal Medicine

## 2022-12-23 VITALS — BP 134/87 | HR 69 | Resp 14 | Ht 65.5 in | Wt 211.0 lb

## 2022-12-23 DIAGNOSIS — J849 Interstitial pulmonary disease, unspecified: Secondary | ICD-10-CM | POA: Diagnosis not present

## 2022-12-23 DIAGNOSIS — R768 Other specified abnormal immunological findings in serum: Secondary | ICD-10-CM

## 2022-12-23 DIAGNOSIS — Z79899 Other long term (current) drug therapy: Secondary | ICD-10-CM | POA: Diagnosis not present

## 2022-12-23 DIAGNOSIS — M351 Other overlap syndromes: Secondary | ICD-10-CM | POA: Diagnosis not present

## 2022-12-23 LAB — CBC WITH DIFFERENTIAL/PLATELET
Absolute Monocytes: 1169 cells/uL — ABNORMAL HIGH (ref 200–950)
Basophils Absolute: 103 cells/uL (ref 0–200)
Basophils Relative: 1.3 %
Eosinophils Absolute: 324 cells/uL (ref 15–500)
Eosinophils Relative: 4.1 %
HCT: 41.2 % (ref 35.0–45.0)
Hemoglobin: 13.4 g/dL (ref 11.7–15.5)
Lymphs Abs: 2022 cells/uL (ref 850–3900)
MCH: 29.3 pg (ref 27.0–33.0)
MCHC: 32.5 g/dL (ref 32.0–36.0)
MCV: 90 fL (ref 80.0–100.0)
MPV: 12.1 fL (ref 7.5–12.5)
Monocytes Relative: 14.8 %
Neutro Abs: 4282 cells/uL (ref 1500–7800)
Neutrophils Relative %: 54.2 %
Platelets: 197 10*3/uL (ref 140–400)
RBC: 4.58 10*6/uL (ref 3.80–5.10)
RDW: 13.6 % (ref 11.0–15.0)
Total Lymphocyte: 25.6 %
WBC: 7.9 10*3/uL (ref 3.8–10.8)

## 2022-12-23 LAB — COMPLETE METABOLIC PANEL WITH GFR
AG Ratio: 0.9 (calc) — ABNORMAL LOW (ref 1.0–2.5)
ALT: 17 U/L (ref 6–29)
AST: 19 U/L (ref 10–35)
Albumin: 3.4 g/dL — ABNORMAL LOW (ref 3.6–5.1)
Alkaline phosphatase (APISO): 68 U/L (ref 37–153)
BUN/Creatinine Ratio: 11 (calc) (ref 6–22)
BUN: 12 mg/dL (ref 7–25)
CO2: 34 mmol/L — ABNORMAL HIGH (ref 20–32)
Calcium: 9.1 mg/dL (ref 8.6–10.4)
Chloride: 102 mmol/L (ref 98–110)
Creat: 1.09 mg/dL — ABNORMAL HIGH (ref 0.50–1.05)
Globulin: 3.7 g/dL (calc) (ref 1.9–3.7)
Glucose, Bld: 99 mg/dL (ref 65–99)
Potassium: 4.1 mmol/L (ref 3.5–5.3)
Sodium: 140 mmol/L (ref 135–146)
Total Bilirubin: 0.3 mg/dL (ref 0.2–1.2)
Total Protein: 7.1 g/dL (ref 6.1–8.1)
eGFR: 56 mL/min/{1.73_m2} — ABNORMAL LOW (ref >=60)

## 2022-12-23 LAB — CK: Total CK: 150 U/L — ABNORMAL HIGH (ref 29–143)

## 2022-12-23 NOTE — Patient Instructions (Signed)
Mycophenolate Capsules What is this medication? MYCOPHENOLATE (mye koe FEN oh late) prevents the body from rejecting an organ transplant. It works by lowering the body's immune system response. This helps the body accept the donor organ. It belongs to a group of medications called immunosuppressants. This medicine may be used for other purposes; ask your health care provider or pharmacist if you have questions. COMMON BRAND NAME(S): CellCept What should I tell my care team before I take this medication? They need to know if you have any of these conditions: Anemia Blood disorder Cancer Diarrhea Immune system problems Infection, such as chickenpox, cold sores, herpes Kidney disease Recent or upcoming vaccine Stomach problems An unusual or allergic reaction to mycophenolate, other medications, foods, dyes, or preservatives Pregnant or trying to get pregnant Breastfeeding How should I use this medication? Take this medication by mouth with a full glass of water. Follow the directions on the prescription label. Take this medication on an empty stomach, at least 1 hour before or 2 hours after food. Do not take with food unless your care team approves. Swallow the medication whole. Do not cut, crush, or chew the medication. If the medication is broken or is not intact, do not get the powder on your skin or eyes. If contact occurs, rinse thoroughly with water. Take your medication at regular intervals. Do not take your medication more often than directed. Do not stop taking except on your care team's advice. A special MedGuide will be given to you by the pharmacist with each prescription and refill. Be sure to read this information carefully each time. Talk to your care team about the use of this medication in children. While this medication may be prescribed for selected conditions, precautions do apply. Overdosage: If you think you have taken too much of this medicine contact a poison control  center or emergency room at once. NOTE: This medicine is only for you. Do not share this medicine with others. What if I miss a dose? If you miss a dose, take it as soon as you can. If it is almost time for your next dose, take only that dose. Do not take double or extra doses. What may interact with this medication? Do not take this medication with any of the following: Live virus vaccines This medication may also interact with the following: Acyclovir or valacyclovir Azathioprine Certain antibiotics, such as ciprofloxacin, levofloxacin, norfloxacin, trimethoprim; sulfamethoxazole, penicillin, amoxicillin; clavulanic acid Certain medications for stomach problems, such as lansoprazole, omeprazole, pantoprazole Cyclosporine Estrogen and progestin hormones Ganciclovir or valganciclovir Isavuconazonium Medications for cholesterol, such as cholestyramine or colestipol Metronidazole Other medications that contain mycophenolate Probenecid Rifampin Sevelamer Stomach acid blockers, such as magnesium hydroxide or aluminum hydroxide Telmisartan This list may not describe all possible interactions. Give your health care provider a list of all the medicines, herbs, non-prescription drugs, or dietary supplements you use. Also tell them if you smoke, drink alcohol, or use illegal drugs. Some items may interact with your medicine. What should I watch for while using this medication? Visit your care team for regular checks on your progress. You will need frequent blood checks during the first few months you are receiving the medication. This medication may affect your coordination, reaction time, or judgment. Do not drive or operate machinery until you know how this medication affects you. Sit up or stand slowly to reduce the risk of dizzy or fainting spells. Drinking alcohol with this medication can increase the risk of these side effects. This medication may   increase your risk of getting an infection.  Call your care team for advice if you get a fever, chills, sore throat, or other symptoms of a cold or flu. Do not treat yourself. Try to avoid being around people who are sick. This medication can make you more sensitive to the sun. Keep out of the sun. If you cannot avoid being in the sun, wear protective clothing and sunscreen. Do not use sun lamps, tanning beds, or tanning booths. Talk to your care team about your risk of cancer. You may be more at risk for certain types of cancer if you take this medication. Talk to your care team if you or your partner may be pregnant. Serious birth defects can occur if you take this medication during pregnancy and for 6 weeks after the last dose. You will need a negative pregnancy test before starting this medication. Estrogen and progestin hormones may not work as well while you are taking this medication. Contraception is recommended while taking this medication and for 6 weeks after the last dose. Your care team can help you find the option that works for you. If your partner can get pregnant, use a condom during sex while taking this medication and for 90 days after the last dose. Talk to your care team before breastfeeding. Changes to your treatment plan may be needed. Do not donate sperm while taking this medication and for 90 days after the last dose. Do not donate blood while you are taking this medication and for 6 weeks after the last dose. Donated blood may contain enough of this medication to cause birth defects in a fetus if transfused to someone who is pregnant. What side effects may I notice from receiving this medication? Side effects that you should report to your care team as soon as possible: Allergic reactions--skin rash, itching, hives, swelling of the face, lips, tongue, or throat Infection--fever, chills, cough, sore throat, wounds that don't heal, pain or trouble when passing urine, general feeling of discomfort or being unwell Joint,  muscle, or tendon pain, swelling, or stiffness Low red blood cell level--unusual weakness or fatigue, dizziness, headache, trouble breathing Peptic ulcer--burning stomach pain, loss of appetite, bloating, burping, heartburn, nausea, vomiting Stomach bleeding--bloody or black, tar-like stools, vomiting blood or brown material that looks like coffee grounds Stomach pain that is severe, does not go away, or gets worse Unusual bruising or bleeding Side effects that usually do not require medical attention (report to your care team if they continue or are bothersome): Diarrhea Dizziness Headache Nausea Swelling of the ankles, hands, or feet Tremors or shaking Trouble sleeping This list may not describe all possible side effects. Call your doctor for medical advice about side effects. You may report side effects to FDA at 1-800-FDA-1088. Where should I keep my medication? Keep out of the reach of children and pets. Store at room temperature between 20 and 25 degrees C (68 and 77 degrees F). Get rid of any unused medication after the expiration date. To get rid of medications that are no longer needed or have expired: Take the medication to a medication take-back program. Check with your pharmacy or law enforcement to find a location. If you cannot return the medication, ask your pharmacist or care team how to get rid of this medication safely. NOTE: This sheet is a summary. It may not cover all possible information. If you have questions about this medicine, talk to your doctor, pharmacist, or health care provider.  2024 [employer]vier/Gold Standard (  2022-04-29 00:00:00)  

## 2022-12-30 ENCOUNTER — Ambulatory Visit (HOSPITAL_COMMUNITY)
Admission: RE | Admit: 2022-12-30 | Discharge: 2022-12-30 | Disposition: A | Discharge status: Home / Self Care | Payer: BC Managed Care – PPO | Source: Ambulatory Visit | Attending: Pulmonary Disease | Admitting: Pulmonary Disease

## 2022-12-30 ENCOUNTER — Encounter (HOSPITAL_BASED_OUTPATIENT_CLINIC_OR_DEPARTMENT_OTHER): Payer: BC Managed Care – PPO

## 2022-12-30 DIAGNOSIS — J849 Interstitial pulmonary disease, unspecified: Secondary | ICD-10-CM | POA: Diagnosis present

## 2022-12-30 LAB — PULMONARY FUNCTION TEST
DL/VA % pred: 72 %
DL/VA: 2.98 ml/min/mmHg/L
DLCO cor % pred: 48 %
DLCO cor: 9.93 ml/min/mmHg
DLCO unc % pred: 48 %
DLCO unc: 9.93 ml/min/mmHg
FEF 25-75 Post: 2.95 L/sec
FEF 25-75 Pre: 3.49 L/sec
FEF2575-%Change-Post: -15 %
FEF2575-%Pred-Post: 137 %
FEF2575-%Pred-Pre: 162 %
FEV1-%Change-Post: -5 %
FEV1-%Pred-Post: 87 %
FEV1-%Pred-Pre: 91 %
FEV1-Post: 2.16 L
FEV1-Pre: 2.28 L
FEV1FVC-%Change-Post: 0 %
FEV1FVC-%Pred-Pre: 118 %
FEV6-%Change-Post: -5 %
FEV6-%Pred-Post: 76 %
FEV6-%Pred-Pre: 80 %
FEV6-Post: 2.38 L
FEV6-Pre: 2.51 L
FEV6FVC-%Pred-Post: 104 %
FEV6FVC-%Pred-Pre: 104 %
FVC-%Change-Post: -4 %
FVC-%Pred-Post: 73 %
FVC-%Pred-Pre: 77 %
FVC-Post: 2.39 L
FVC-Pre: 2.51 L
Post FEV1/FVC ratio: 91 %
Post FEV6/FVC ratio: 100 %
Pre FEV1/FVC ratio: 91 %
Pre FEV6/FVC Ratio: 100 %
RV % pred: 38 %
RV: 0.82 L
TLC % pred: 64 %
TLC: 3.37 L

## 2022-12-30 MED ORDER — ALBUTEROL SULFATE (2.5 MG/3ML) 0.083% IN NEBU
2.5000 mg | INHALATION_SOLUTION | Freq: Once | RESPIRATORY_TRACT | Status: AC
  Administered 2022-12-30: 2.5 mg via RESPIRATORY_TRACT

## 2022-12-31 ENCOUNTER — Encounter: Payer: Self-pay | Admitting: Pulmonary Disease

## 2022-12-31 ENCOUNTER — Ambulatory Visit (INDEPENDENT_AMBULATORY_CARE_PROVIDER_SITE_OTHER): Payer: BC Managed Care – PPO | Admitting: Pulmonary Disease

## 2022-12-31 VITALS — BP 130/80 | HR 88 | Ht 65.5 in | Wt 210.6 lb

## 2022-12-31 DIAGNOSIS — J849 Interstitial pulmonary disease, unspecified: Secondary | ICD-10-CM

## 2022-12-31 DIAGNOSIS — J479 Bronchiectasis, uncomplicated: Secondary | ICD-10-CM

## 2022-12-31 NOTE — Patient Instructions (Addendum)
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

## 2022-12-31 NOTE — Progress Notes (Signed)
Synopsis: Referred in May 2022 for cough  Subjective:   PATIENT ID: Jocelyn Bond GENDER: female DOB: 1955/10/27, MRN: [id number]  HPI  Chief Complaint  Patient presents with   Follow-up    F/up on medication management    Jocelyn Bond is a 67 year old woman, never smoker with hypertension and obesity who returns to pulmonary clinic for bronchiectasis and interstitial lung disease.   Seen by rheumatology 12/23/22 with plan to start CellCept with goal dose of 1000mg  twice daily. We discussed the treatment plan in order reduce further inflammation of her lungs and to protect her current lung function. She is ready to start treatment and will meet with our pharmacy team.  PFTs show mild restriction and moderate diffusion defect. Decrease in DLCO and TLC compared to 2 years ago.  OV 09/09/22 HRCT Chest shows peripheral and basilar predominant traction bronchiectasis/bronchiolectasis, coarsened ground-glass and subpleural reticular densities. No definitive honeycombing. Very minimally progressive since 10/23/20.   PFTs not completed since last visit.  OV 08/04/22 She overall continues to feel well without shortness of breath or wheezing. She does have dry cough that is stable. No joint pains or stiffness. No new skin rashes. She never went to a dermatologist for skin biopsy.  OV 02/24/21 She was initially evaluated for chronic cough and noted to have bronchiectasis and fibrotic features on her CT chest imaging predominantly at the bases.   Serologic workup showed CK 199, ANA titer 1:1,280 with a cytoplasmic/reticular pattern, weak positive Anti-PL-12 ab, Anti-SS-A ab 47, Anti-U1 RNP ab 152, and Anti-U2 RNP ab moderate positive. Serologic workup is negative for hypersensitivity pneumonitis panel. CK level is elevated at 239 on 01/29/21 and 199 on 11/28/20.   Pulmonary function tests 12/10/20 show mild restrictive defect and moderate diffusion defect.   She was evaluated by Dr. Dimple Casey of  rheumatology on 01/29/21 for concern of systemic sclerosis or polymyositis. 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 No specific anti-inflammatory treatment was recommended at this time.   She continues on stiolto with good improvement in her breathing.  Past Medical History:  Diagnosis Date   Allergy    very rare    Hypertension    ILD (interstitial lung disease) (HCC)    Obesity      Family History  Problem Relation Age of Onset   Hypertension Mother    Hypertension Father    Hypertension Sister    Cancer Sister        breast ca   Hypertension Sister    Arthritis/Rheumatoid Sister    Hypertension Sister    Fibromyalgia Sister    Hypertension Sister    Healthy Son    Healthy Daughter    Healthy Daughter    Colon cancer Neg Hx    Colon polyps Neg Hx    Esophageal cancer Neg Hx    Rectal cancer Neg Hx    Stomach cancer Neg Hx      Social History   Socioeconomic History   Marital status: Single    Spouse name: Not on file   Number of children: Not on file   Years of education: Not on file   Highest education level: Not on file  Occupational History   Not on file  Tobacco Use   Smoking status: Never  Passive exposure: Never   Smokeless tobacco: Never  Vaping Use   Vaping Use: Never used  Substance and Sexual Activity   Alcohol use: Yes    Alcohol/week: 1.0 standard drink of alcohol    Types: 1 Glasses of wine per week    Comment: rare    Drug use: No   Sexual activity: Not Currently  Other Topics Concern   Not on file  Social History Narrative   Not on file   Social Determinants of Health   Financial Resource Strain: Not on file  Food Insecurity: Not on file  Transportation Needs: Not on file  Physical  Activity: Not on file  Stress: Not on file  Social Connections: Not on file  Intimate Partner Violence: Not on file     No Known Allergies   Outpatient Medications Prior to Visit  Medication Sig Dispense Refill   Cyanocobalamin (B-12) 5000 MCG CAPS Take by mouth.     Emollient (SKIN REPAIR) LOTN      Ginkgo Biloba 40 MG TABS Take by mouth.     glucosamine-chondroitin 500-400 MG tablet Take 1 tablet by mouth 3 (three) times daily.     Gotu [place], Environmental manager asiatica, (GOTU KOLA PO) Take 1 capsule by mouth.     losartan-hydrochlorothiazide (HYZAAR) 50-12.5 MG tablet Take 1 tablet by mouth daily. 90 tablet 3   Magnesium 250 MG TABS Take by mouth.     Multiple Vitamins-Minerals (MULTIVITAMIN WITH MINERALS) tablet Take 1 tablet by mouth daily.     OVER THE COUNTER MEDICATION Lung and bronchial     OVER THE COUNTER MEDICATION optihear     rosuvastatin (CRESTOR) 20 MG tablet Take 1 tablet (20 mg total) by mouth daily. 90 tablet 3   STIOLTO RESPIMAT 2.5-2.5 MCG/ACT AERS INHALE 2 PUFFS BY MOUTH INTO THE LUNGS DAILY 3 each 3   No facility-administered medications prior to visit.   Review of Systems  Constitutional:  Negative for chills, fever, malaise/fatigue and weight loss.  HENT:  Negative for congestion, sinus pain and sore throat.   Eyes: Negative.   Respiratory:  Positive for cough. Negative for hemoptysis, sputum production, shortness of breath and wheezing.   Cardiovascular:  Negative for chest pain, palpitations, orthopnea, claudication and leg swelling.  Gastrointestinal:  Negative for abdominal pain, heartburn, nausea and vomiting.  Genitourinary: Negative.   Musculoskeletal:  Negative for joint pain and myalgias.  Skin:  Negative for rash.  Neurological:  Negative for weakness.  Endo/Heme/Allergies: Negative.   Psychiatric/Behavioral: Negative.     Objective:   Vitals:   12/31/22 0913  BP: 130/80  Pulse: 88  SpO2: 99%  Weight: 210 lb 9.6 oz (95.5 kg)  Height: 5' 5.5"  (1.664 m)   Physical Exam Constitutional:      General: She is not in acute distress.    Appearance: She is not ill-appearing.  HENT:     Head: Normocephalic and atraumatic.  Eyes:     General: No scleral icterus.    Conjunctiva/sclera: Conjunctivae normal.  Cardiovascular:     Rate and Rhythm: Normal rate and regular rhythm.     Pulses: Normal pulses.     Heart sounds: Normal heart sounds. No murmur heard. Pulmonary:     Effort: Pulmonary effort is normal.     Breath sounds: Rales (bibasilar bases) present. No wheezing or rhonchi.  Musculoskeletal:        General: No swelling or tenderness.     Right lower leg: No edema.  Left lower leg: No edema.  Skin:    General: Skin is warm and dry.     Capillary Refill: Capillary refill takes less than 2 seconds.     Findings: No lesion or rash.  Neurological:     General: No focal deficit present.     Mental Status: She is alert.     CBC    Component Value Date/Time   WBC 7.9 12/23/2022 1050   RBC 4.58 12/23/2022 1050   HGB 13.4 12/23/2022 1050   HGB 13.3 07/14/2022 1555   HCT 41.2 12/23/2022 1050   HCT 41.3 07/14/2022 1555   PLT 197 12/23/2022 1050   PLT 302 07/14/2022 1555   MCV 90.0 12/23/2022 1050   MCV 91 07/14/2022 1555   MCH 29.3 12/23/2022 1050   MCHC 32.5 12/23/2022 1050   RDW 13.6 12/23/2022 1050   RDW 13.7 07/14/2022 1555   LYMPHSABS 2,022 12/23/2022 1050   LYMPHSABS 2.7 07/14/2022 1555   MONOABS 1,078 (H) 12/19/2015 0825   EOSABS 324 12/23/2022 1050   EOSABS 0.4 07/14/2022 1555   BASOSABS 103 12/23/2022 1050   BASOSABS 0.1 07/14/2022 1555      Latest Ref Rng & Units 12/23/2022   10:50 AM 07/14/2022    3:55 PM 06/30/2021    1:31 PM  BMP  Glucose 65 - 99 mg/dL 99  87  84   BUN 7 - 25 mg/dL 12  16  13    Creatinine 0.50 - 1.05 mg/dL 1.61  0.96  0.45   BUN/Creat Ratio 6 - 22 (calc) 11  15  13    Sodium 135 - 146 mmol/L 140  138  142   Potassium 3.5 - 5.3 mmol/L 4.1  4.1  4.1   Chloride 98 - 110  mmol/L 102  99  103   CO2 20 - 32 mmol/L 34  26  29   Calcium 8.6 - 10.4 mg/dL 9.1  8.9  9.1    Chest imaging: HRCT Chest 09/02/22 1. Pulmonary parenchymal pattern of fibrosis appears very minimally progressive from 10/23/2020. Findings are categorized as probable UIP per consensus guidelines: Diagnosis of Idiopathic Pulmonary Fibrosis: An Official ATS/ERS/JRS/ALAT Clinical Practice Guideline. Am Rosezetta Schlatter Crit Care Med Vol 198, Iss 5, [phone], Mar 05 2017. 2. Tiny right renal stones. 3.  Aortic atherosclerosis (ICD10-I70.0). 4. Enlarged pulmonic trunk, indicative of pulmonary arterial hypertension.  CT Chest 10/23/20 Mediastinum/Nodes: Mediastinal and hilar lymph nodes are not enlarged by CT size criteria. No axillary adenopathy. Esophagus is grossly unremarkable.   Lungs/Pleura: Peripheral and basilar predominant subpleural reticulation, ground-glass and traction bronchiectasis/bronchiolectasis. Postoperative changes in the right hemithorax. No pleural fluid. Airway is unremarkable.  PFT:    Latest Ref Rng & Units 12/30/2022    2:45 PM 12/10/2020    3:56 PM  PFT Results  FVC-Pre L 2.51  2.37   FVC-Predicted Pre % 77  89   FVC-Post L 2.39  2.41   FVC-Predicted Post % 73  90   Pre FEV1/FVC % % 91  89   Post FEV1/FCV % % 91  90   FEV1-Pre L 2.28  2.12   FEV1-Predicted Pre % 91  101   FEV1-Post L 2.16  2.17   DLCO uncorrected ml/min/mmHg 9.93  11.50   DLCO UNC% % 48  55   DLCO corrected ml/min/mmHg 9.93  11.50   DLCO COR %Predicted % 48  55   DLVA Predicted % 72  88   TLC L 3.37  3.76  TLC % Predicted % 64  72   RV % Predicted % 38  64   PFT - Mild restriction and moderate diffusion defect  Path: 2009:  1,2,3. LUNG, RIGHT LOWER LOBE AND RIGHT MIDDLE LOBE OPEN,   BIOPSIES:   - BRONCHIOLITIS OBLITERANS ORGANIZING PNEUMONIA (BOOP)    Assessment & Plan:   ILD (interstitial lung disease) (HCC)  Bronchiectasis without complication (HCC)  Discussion: Jocelyn Bond is a 67 year old woman, never smoker with hypertension, obesity and bronchiolitis obliterans organizing pneumonia in 2009 who returns to pulmonary clinic for bronchiectasis and interstitial lung disease.   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 PFTs yesterday show slight decline in lung function with mild restriction and moderate diffusion defect.  Rheumatology has recommended CellCept therapy, which I agree with. She will be scheduled with our pharmacy team to initiate therapy and lab monitoring.  Follow up in 3 months.  Melody Comas, MD [facility] Office: [phone]    Current Outpatient Medications:    Cyanocobalamin (B-12) 5000 MCG CAPS, Take by mouth., Disp: , Rfl:    Emollient (SKIN REPAIR) LOTN, , Disp: , Rfl:    Ginkgo Biloba 40 MG TABS, Take by mouth., Disp: , Rfl:    glucosamine-chondroitin 500-400 MG tablet, Take 1 tablet by mouth 3 (three) times daily., Disp: , Rfl:    Gotu [place], Centella asiatica, (GOTU KOLA PO), Take 1 capsule by mouth., Disp: , Rfl:    losartan-hydrochlorothiazide (HYZAAR) 50-12.5 MG tablet, Take 1 tablet by mouth daily., Disp: 90 tablet, Rfl: 3   Magnesium 250 MG TABS, Take by mouth., Disp: , Rfl:    Multiple Vitamins-Minerals (MULTIVITAMIN WITH MINERALS) tablet, Take 1 tablet by mouth daily., Disp: , Rfl:    OVER THE COUNTER MEDICATION, Lung and bronchial, Disp: , Rfl:    OVER THE COUNTER MEDICATION, optihear, Disp: , Rfl:    rosuvastatin (CRESTOR) 20 MG tablet, Take 1 tablet (20 mg total) by mouth daily., Disp: 90 tablet, Rfl: 3   STIOLTO RESPIMAT 2.5-2.5 MCG/ACT AERS, INHALE 2 PUFFS BY MOUTH INTO THE LUNGS DAILY, Disp: 3 each, Rfl: 3

## 2023-01-20 ENCOUNTER — Other Ambulatory Visit: Payer: Self-pay | Admitting: Family Medicine

## 2023-01-20 DIAGNOSIS — J849 Interstitial pulmonary disease, unspecified: Secondary | ICD-10-CM

## 2023-01-28 NOTE — Progress Notes (Signed)
Office Visit Note  Patient: Jocelyn Bond             Date of Birth: 05-19-1956           MRN: [id number]             PCP: Ronnald Nian, MD Referring: Ronnald Nian, MD Visit Date: 02/03/2023   Subjective:  Follow-up   History of Present Illness: Jocelyn Bond is a 67 y.o. 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  She has not this any particular changes with joint or muscle pain or weakness.  Still has skin rashes most prominent with raised bumps on both arms not particularly painful or itchy.  Use of topical steroids was not significantly effective.  She had follow-up in pulmonology clinic with PFTs consistent with mild restrictive and diffusion defect somewhat worse compared to 2022 study.  Previous HPI 12/23/2022 Jocelyn Bond is a 67 y.o. 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  Since our last visit she has not had major change in symptoms.  Skin rash on her arms has changed slightly with some raised bumps as well as the discolored areas mostly around the elbows.  These are not itchy or painful.  She also has a painful area on the right lateral thigh this only hurts on some days.  Does not cause associated hip or knee pain and she does not see any visible swelling.  No radiation down to the foot.  Still sees intermittent discoloration and her fingers but less often with warm weather.  No particular cough or shortness of breath complaint.   Previous HPI 09/21/22 Jocelyn Bond is a 67 y.o. female here for follow up for systemic inflammatory symptoms and abnormal labs associated with interstitial lung disease.  It has been about 2 years since last follow-up she is doing well so did not pursue much additional workup or the recommended skin biopsy.  Repeat lung CT chest shows mild progression compared to 2022 imaging so was recommended  probably needing to start treatment for her inflammatory lung disease.  She continues having mild amount of joint pain and stiffness in several areas but not seeing a lot of visible swelling and not taking any medications for this.  Has not been experiencing any worsening trouble with acid reflux or dysphagia.  She still having issues of sometimes violaceous sometimes erythematous discoloration in her distal fingers with cold exposure.  Not associated with any nail changes or skin lesions.  She had an episode of rash across the back of her neck that resolved mostly on its own did use some over-the-counter topical cream.   Previous HPI 01/29/21 Jocelyn Bond is a 67 y.o. female here for evaluation for systemic connective tissue disease with positive antibodies and diagnosis of ILD.  She does seem to have a degree of chronic nonproductive cough but denies significant shortness of breath with exertion or at rest.  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  Current coughing has been noticeable since about 1 year ago.  She denies any particular complaints with joint pains or swelling.  She experiences some chronic rash on her distal arms and hands bilaterally that is controlled with topical treatment.  She experiences purple discoloration in the tips of the third finger of each hand  with cold exposure that improved after rewarming.  She reports occasional choking on solid foods and having to regurgitate but this is infrequent.  She denies symptomatic acid reflux.  She experiences swelling of the right foot chronically states previous studies have indicated venous insufficiency around the level of the ankle without a particularly identified cause.  She denies any history of blood clots.  She also denies alopecia, oral ulcers, or photosensitive skin rashes.    Labs reviewed 11/2020 ANA 1:1280 cytoplasmic/reticular 1:1280 speckled Scl-70, SSA, SSb neg RF 40 CCP neg MyoMarker 3 Pl-12 weak pos, Ku weak pos, SSA 52kD 47, U1RNP 152, U2RNP moderate pos ANCA neg CK 199   Review of Systems  Constitutional:  Negative for fatigue.  HENT:  Negative for mouth sores and mouth dryness.   Eyes:  Negative for dryness.  Respiratory:  Negative for shortness of breath.   Cardiovascular:  Negative for chest pain and palpitations.  Gastrointestinal:  Negative for blood in stool, constipation and diarrhea.  Endocrine: Negative for increased urination.  Genitourinary:  Negative for involuntary urination.  Musculoskeletal:  Positive for myalgias and myalgias. Negative for joint pain, gait problem, joint pain, joint swelling, muscle weakness, morning stiffness and muscle tenderness.  Skin:  Positive for sensitivity to sunlight. Negative for color change, rash and hair loss.  Allergic/Immunologic: Negative for susceptible to infections.  Neurological:  Negative for dizziness and headaches.  Hematological:  Negative for swollen glands.  Psychiatric/Behavioral:  Negative for depressed mood and sleep disturbance. The patient is not nervous/anxious.     PMFS History:  Patient Active Problem List   Diagnosis Date Noted   MCTD (mixed connective tissue disease) (HCC) 12/23/2022   High frequency hearing loss 06/30/2021   Aortic atherosclerosis (HCC) 06/30/2021   Positive ANA (antinuclear antibody) 01/29/2021   ILD (interstitial lung disease) (HCC) 01/29/2021   High risk medication use 01/29/2021   Seborrheic keratosis 06/20/2019   Lymphadenopathy of head and neck 02/16/2016   Chronic venous insufficiency 12/11/2012   Obesity (BMI 30-39.9) 12/11/2012   Essential hypertension 01/09/2008    Past Medical History:  Diagnosis Date   Allergy    very rare    Hypertension    ILD (interstitial lung disease) (HCC)    Obesity     Family History  Problem Relation  Age of Onset   Hypertension Mother    Hypertension Father    Hypertension Sister    Cancer Sister        breast ca   Hypertension Sister    Arthritis/Rheumatoid Sister    Hypertension Sister    Fibromyalgia Sister    Hypertension Sister    Healthy Son    Healthy Daughter    Healthy Daughter    Colon cancer Neg Hx    Colon polyps Neg Hx    Esophageal cancer Neg Hx    Rectal cancer Neg Hx    Stomach cancer Neg Hx    Past Surgical History:  Procedure Laterality Date   BTL     BUNIONECTOMY     COLONOSCOPY  2009   13 yrs ago Deboraha Sprang- pt states removed polyps , no report, no path    LUNG BIOPSY  2009   TUBAL LIGATION     Social History   Social History Narrative   Not on file   Immunization History  Administered Date(s) Administered   Fluad Quad(high Dose 65+) 02/19/2022   Influenza,inj,Quad PF,6+ Mos 04/10/2018, 06/12/2020, 04/13/2021   Influenza-Unspecified 05/05/2016   Moderna Covid-19 Vaccine Bivalent  Booster 46yrs & up 02/13/2021   Moderna Sars-Covid-2 Vaccination 09/13/2019, 10/16/2019, 06/09/2020   PNEUMOCOCCAL CONJUGATE-20 06/30/2021   Pfizer Covid-19 Vaccine Bivalent Booster 1yrs & up 02/19/2022   Respiratory Syncytial Virus Vaccine,Recomb Aduvanted(Arexvy) 07/16/2022   Tdap 08/06/2010, 04/13/2021   Zoster Recombinant(Shingrix) 04/10/2018, 07/11/2018     Objective: Vital Signs: BP 127/86 (BP Location: Left Arm, Patient Position: Sitting, Cuff Size: Normal)   Pulse 81   Resp 12   Ht 5' 4.5" (1.638 m)   Wt 213 lb (96.6 kg)   BMI 36.00 kg/m    Physical Exam Cardiovascular:     Rate and Rhythm: Normal rate and regular rhythm.  Pulmonary:     Effort: Pulmonary effort is normal.     Comments: Bilateral inspiratory crackles Skin:    General: Skin is warm and dry.     Findings: Rash present.     Comments: Skin colored papular rash on both arms most at elbow and forearm extensor surfaces  Neurological:     Mental Status: She is alert.       Musculoskeletal Exam:  Shoulders full ROM no tenderness or swelling Elbows full ROM no tenderness or swelling Wrists full ROM no tenderness or swelling Fingers full ROM no tenderness or swelling Knees full ROM no tenderness or swelling  Investigation: No additional findings.  Imaging: No results found.  Recent Labs: Lab Results  Component Value Date   WBC 7.9 12/23/2022   HGB 13.4 12/23/2022   PLT 197 12/23/2022   NA 140 12/23/2022   K 4.1 12/23/2022   CL 102 12/23/2022   CO2 34 (H) 12/23/2022   GLUCOSE 99 12/23/2022   BUN 12 12/23/2022   CREATININE 1.09 (H) 12/23/2022   BILITOT 0.3 12/23/2022   ALKPHOS 74 07/14/2022   AST 19 12/23/2022   ALT 17 12/23/2022   PROT 7.1 12/23/2022   ALBUMIN 3.7 (L) 07/14/2022   CALCIUM 9.1 12/23/2022   GFRAA 61 06/23/2020   QFTBGOLDPLUS NEGATIVE 01/29/2021    Speciality Comments: No specialty comments available.  Procedures:  No procedures performed Allergies: Patient has no known allergies.   Assessment / Plan:     Visit Diagnoses: MCTD (mixed connective tissue disease) (HCC) - Plan: mycophenolate (CELLCEPT) 500 MG tablet  Clinical picture incomplete for mixed connective tissue disease or dermatomyositis with abnormal serology and elevated CK but only apparent symptoms with skin rash and lung disease.  Never had the previously discussed skin biopsy but would not differentiate initial treatment plan.  Starting mycophenolate initial 500 mg twice daily for first month then reprat labs. If okay can titrate up to 1000 mg twice daily.  High risk medication use - Plan to start CellCept titrate up to 1000 mg twice daily. - Plan: CBC with Differential/Platelet, COMPLETE METABOLIC PANEL WITH GFR  Checking CBC and CMP baseline medication monitoring for mycophenolate.  Reviewed risk of medication including cytopenias, hepatotoxicity, infection, or malignancy risk with long-term use.  ILD (interstitial lung disease) (HCC)  Following up with  Dr. Francine Graven most recent PFTs mildly worse compared to years prior which is suggestive for ongoing disease, treatment with underlying CTD-associated ILD.  Orders: Orders Placed This Encounter  Procedures   CBC with Differential/Platelet   COMPLETE METABOLIC PANEL WITH GFR   Meds ordered this encounter  Medications   mycophenolate (CELLCEPT) 500 MG tablet    Sig: Take 1 tablet (500 mg total) by mouth 2 (two) times daily.    Dispense:  120 tablet    Refill:  0  Follow-Up Instructions: Return in about 10 weeks (around 04/14/2023) for MCTD/ILD MMF start f/u 2mos.   Fuller Plan, MD  Note - This record has been created using [employer].  Chart creation errors have been sought, but may not always  have been located. Such creation errors do not reflect on  the standard of medical care.

## 2023-02-03 ENCOUNTER — Encounter: Payer: Self-pay | Admitting: Internal Medicine

## 2023-02-03 ENCOUNTER — Ambulatory Visit: Payer: BC Managed Care – PPO | Attending: Internal Medicine | Admitting: Internal Medicine

## 2023-02-03 VITALS — BP 127/86 | HR 81 | Resp 12 | Ht 64.5 in | Wt 213.0 lb

## 2023-02-03 DIAGNOSIS — Z79899 Other long term (current) drug therapy: Secondary | ICD-10-CM | POA: Diagnosis not present

## 2023-02-03 DIAGNOSIS — M351 Other overlap syndromes: Secondary | ICD-10-CM | POA: Diagnosis not present

## 2023-02-03 DIAGNOSIS — J849 Interstitial pulmonary disease, unspecified: Secondary | ICD-10-CM | POA: Diagnosis not present

## 2023-02-03 MED ORDER — MYCOPHENOLATE MOFETIL 500 MG PO TABS: 500.0000 mg | ORAL_TABLET | Freq: Two times a day (BID) | ORAL | 0 refills | Status: DC

## 2023-03-02 ENCOUNTER — Other Ambulatory Visit: Payer: Self-pay | Admitting: *Deleted

## 2023-03-02 DIAGNOSIS — M351 Other overlap syndromes: Secondary | ICD-10-CM

## 2023-03-02 MED ORDER — MYCOPHENOLATE MOFETIL 500 MG PO TABS: 500.0000 mg | ORAL_TABLET | Freq: Two times a day (BID) | ORAL | 0 refills | Status: DC

## 2023-03-02 NOTE — Telephone Encounter (Signed)
Received voicemail from [facility] requesting refill on Cellcept on behalf of the patient.   Last Fill: 02/03/2023 (30 day supply)  Labs: 12/23/2022 Absolute Monocytes 1,169, Creat. 1.09, GFR 56, CO2 34, Albumin 3.4  Next Visit: 04/18/2023  Last Visit: 02/03/2023  DX: MCTD (mixed connective tissue disease)   Current Dose per office note 02/03/2023: Plan to start CellCept titrate up to 1000 mg twice daily.   Okay to refill Cellcept?

## 2023-04-04 NOTE — Progress Notes (Signed)
Office Visit Note  Patient: Jocelyn Bond             Date of Birth: 07-Dec-1955           MRN: [id number]             PCP: Ronnald Nian, MD Referring: Ronnald Nian, MD Visit Date: 04/18/2023   Subjective:  Follow-up (Patient states she stopped taking the cellcept last week because of throwing up and diarrhea. )   History of Present Illness: Jocelyn Bond is a 67 y.o. 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  She started taking mycophenolate 500 mg twice daily initially without problem but for more than a week was experiencing persistent nausea vomiting and diarrhea.  She was not able to appreciate any difference in swelling rashes nodules or any respiratory complaint.  Lab results from October 2 still showing elevated CK and ALT lab test.  Previous HPI 02/03/2023 Jocelyn Bond is a 67 y.o. 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  She has not this any particular changes with joint or muscle pain or weakness.  Still has skin rashes most prominent with raised bumps on both arms not particularly painful or itchy.  Use of topical steroids was not significantly effective.  She had follow-up in pulmonology clinic with PFTs consistent with mild restrictive and diffusion defect somewhat worse compared to 2022 study.   Previous HPI 12/23/2022 Jocelyn Bond is a 67 y.o. 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  Since our last visit she has not had major change in symptoms.  Skin rash on her arms has changed slightly with some raised bumps as well as the discolored areas mostly around the elbows.  These are not itchy or painful.  She also has a painful area on the right lateral thigh this only hurts on some days.  Does not cause associated hip or knee pain  and she does not see any visible swelling.  No radiation down to the foot.  Still sees intermittent discoloration and her fingers but less often with warm weather.  No particular cough or shortness of breath complaint.   Previous HPI 09/21/22 Jocelyn Bond is a 67 y.o. female here for follow up for systemic inflammatory symptoms and abnormal labs associated with interstitial lung disease.  It has been about 2 years since last follow-up she is doing well so did not pursue much additional workup or the recommended skin biopsy.  Repeat lung CT chest shows mild progression compared to 2022 imaging so was recommended probably needing to start treatment for her inflammatory lung disease.  She continues having mild amount of joint pain and stiffness in several areas but not seeing a lot of visible swelling and not taking any medications for this.  Has not been experiencing any worsening trouble with acid reflux or dysphagia.  She still having issues of sometimes violaceous sometimes erythematous discoloration in her distal fingers with cold exposure.  Not associated with any nail changes or skin lesions.  She had an episode of rash across the back of her neck that resolved mostly on its own did use some over-the-counter topical cream.   Previous HPI 01/29/21 Jocelyn Bond is a 67 y.o. female here for evaluation for systemic connective tissue disease with positive antibodies and diagnosis of ILD.  She  does seem to have a degree of chronic nonproductive cough but denies significant shortness of breath with exertion or at rest.  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  Current coughing has been noticeable since about 1 year ago.  She denies any particular complaints with joint pains or swelling.  She experiences some chronic rash on her distal arms and  hands bilaterally that is controlled with topical treatment.  She experiences purple discoloration in the tips of the third finger of each hand with cold exposure that improved after rewarming.  She reports occasional choking on solid foods and having to regurgitate but this is infrequent.  She denies symptomatic acid reflux.  She experiences swelling of the right foot chronically states previous studies have indicated venous insufficiency around the level of the ankle without a particularly identified cause.  She denies any history of blood clots.  She also denies alopecia, oral ulcers, or photosensitive skin rashes.   Labs reviewed 11/2020 ANA 1:1280 cytoplasmic/reticular 1:1280 speckled Scl-70, SSA, SSb neg RF 40 CCP neg MyoMarker 3 Pl-12 weak pos, Ku weak pos, SSA 52kD 47, U1RNP 152, U2RNP moderate pos ANCA neg CK 199   Review of Systems  Constitutional:  Negative for fatigue.  HENT:  Negative for mouth sores and mouth dryness.   Eyes:  Negative for dryness.  Respiratory:  Negative for shortness of breath.   Cardiovascular:  Negative for chest pain and palpitations.  Gastrointestinal:  Positive for diarrhea. Negative for blood in stool and constipation.  Endocrine: Negative for increased urination.  Genitourinary:  Negative for involuntary urination.  Musculoskeletal:  Positive for morning stiffness and muscle tenderness. Negative for joint pain, gait problem, joint pain, joint swelling, myalgias, muscle weakness and myalgias.  Skin:  Positive for sensitivity to sunlight. Negative for color change, rash and hair loss.  Allergic/Immunologic: Negative for susceptible to infections.  Neurological:  Positive for dizziness. Negative for headaches.  Hematological:  Negative for swollen glands.  Psychiatric/Behavioral:  Negative for depressed mood and sleep disturbance. The patient is not nervous/anxious.     PMFS History:  Patient Active Problem List   Diagnosis Date Noted   MCTD  (mixed connective tissue disease) (HCC) 12/23/2022   High frequency hearing loss 06/30/2021   Aortic atherosclerosis (HCC) 06/30/2021   Positive ANA (antinuclear antibody) 01/29/2021   ILD (interstitial lung disease) (HCC) 01/29/2021   High risk medication use 01/29/2021   Seborrheic keratosis 06/20/2019   Lymphadenopathy of head and neck 02/16/2016   Chronic venous insufficiency 12/11/2012   Obesity (BMI 30-39.9) 12/11/2012   Essential hypertension 01/09/2008    Past Medical History:  Diagnosis Date   Allergy    very rare    Hypertension    ILD (interstitial lung disease) (HCC)    Obesity     Family History  Problem Relation Age of Onset   Hypertension Mother    Hypertension Father    Hypertension Sister    Cancer Sister        breast ca   Hypertension Sister    Arthritis/Rheumatoid Sister    Hypertension Sister    Fibromyalgia Sister    Hypertension Sister    Healthy Son    Healthy Daughter    Healthy Daughter    Colon cancer Neg Hx    Colon polyps Neg Hx    Esophageal cancer Neg Hx    Rectal cancer Neg Hx  Stomach cancer Neg Hx    Past Surgical History:  Procedure Laterality Date   BTL     BUNIONECTOMY     COLONOSCOPY  2009   13 yrs ago [facility]- pt states removed polyps , no report, no path    LUNG BIOPSY  2009   TUBAL LIGATION     Social History   Social History Narrative   Not on file   Immunization History  Administered Date(s) Administered   Fluad Quad(high Dose 65+) 02/19/2022   Influenza,inj,Quad PF,6+ Mos 04/10/2018, 06/12/2020, 04/13/2021   Influenza-Unspecified 05/05/2016   Moderna Covid-19 Fall Seasonal Vaccine 48yrs & older 04/24/2023   Moderna Covid-19 Vaccine Bivalent Booster 80yrs & up 02/13/2021   Moderna Sars-Covid-2 Vaccination 09/13/2019, 10/16/2019, 06/09/2020   PNEUMOCOCCAL CONJUGATE-20 06/30/2021   Pfizer Covid-19 Vaccine Bivalent Booster 65yrs & up 02/19/2022   Respiratory Syncytial Virus Vaccine,Recomb Aduvanted(Arexvy)  07/16/2022   Tdap 08/06/2010, 04/13/2021   Zoster Recombinant(Shingrix) 04/10/2018, 07/11/2018     Objective: Vital Signs: BP 133/86 (BP Location: Left Arm, Patient Position: Sitting, Cuff Size: Normal)   Pulse 80   Resp 14   Ht 5' 4.5" (1.638 m)   Wt 218 lb (98.9 kg)   BMI 36.84 kg/m    Physical Exam Eyes:     Conjunctiva/sclera: Conjunctivae normal.  Cardiovascular:     Rate and Rhythm: Normal rate and regular rhythm.  Pulmonary:     Effort: Pulmonary effort is normal.     Comments: Inspiratory crackles at both lung bases Lymphadenopathy:     Cervical: No cervical adenopathy.  Skin:    General: Skin is warm and dry.     Findings: Rash present.     Comments: Skin colored papular rash on both arms most at elbow and forearm extensor  Neurological:     Mental Status: She is alert.  Psychiatric:        Mood and Affect: Mood normal.      Musculoskeletal Exam:  Shoulders full ROM no tenderness or swelling Elbows full ROM no tenderness or swelling Wrists full ROM no tenderness or swelling Fingers full ROM, mild nodules at multiple finger joints with no tenderness, no palpable swelling Knees full ROM no tenderness or swelling   Investigation: No additional findings.  Imaging: No results found.  Recent Labs: Lab Results  Component Value Date   WBC 7.9 04/06/2023   HGB 13.3 04/06/2023   PLT 227 04/06/2023   NA 140 04/06/2023   K 3.6 04/06/2023   CL 101 04/06/2023   CO2 33 (H) 04/06/2023   GLUCOSE 98 04/06/2023   BUN 14 04/06/2023   CREATININE 1.01 04/06/2023   BILITOT 0.5 04/18/2023   ALKPHOS 74 07/14/2022   AST 29 04/18/2023   ALT 32 (H) 04/18/2023   PROT 7.4 04/18/2023   ALBUMIN 3.7 (L) 07/14/2022   CALCIUM 9.0 04/06/2023   GFRAA 61 06/23/2020   QFTBGOLDPLUS NEGATIVE 01/29/2021    Speciality Comments: No specialty comments available.  Procedures:  No procedures performed Allergies: Patient has no known allergies.   Assessment / Plan:     Visit  Diagnoses: MCTD (mixed connective tissue disease) (HCC) - Plan: CK  Clinical picture remains consistent with active connective tissue disease although symptoms are very mild and not impairing function.  Has not tolerated mycophenolate well due to GI side effects.  Discussed possible options of trying a switch to metabolite or solution but will see if we could switch to azathioprine for better tolerance.  Rechecking CK level for inflammatory  activity monitoring.  If labs okay would start at 50 mg once daily with plan to titrate.  High risk medication use - Plan: Thiopurine methyltransferase(tpmt)rbc, Hepatic function panel  Checking TPMT for possibility of switching to azathioprine screening for risk of toxicity.  Will also check another hepatic function panel although the previous elevation may also be secondary to high CK.  Reviewed risk of medication including cytopenias, hepatotoxicity, increased risk of malignancy with very long-term use, and infection risk.  ILD (interstitial lung disease) (HCC) - Following up with Dr. Delice Lesch consistent with CTD associated ILD.  Not particularly symptomatic exam remains similar with abnormal breath sounds.  Would not expect any difference from the extremely limited duration of medicine tolerated.  Will need to continue pulmonology follow-up for serial assessments.  Orders: Orders Placed This Encounter  Procedures   Thiopurine methyltransferase(tpmt)rbc   CK   Hepatic function panel   No orders of the defined types were placed in this encounter.    Follow-Up Instructions: Return in about 3 months (around 07/19/2023) for MCTD/ILD AZA start f/u 3mos.   Fuller Plan, MD  Note - This record has been created using [employer].  Chart creation errors have been sought, but may not always  have been located. Such creation errors do not reflect on  the standard of medical care.

## 2023-04-06 ENCOUNTER — Telehealth: Payer: Self-pay | Admitting: Internal Medicine

## 2023-04-06 DIAGNOSIS — Z79899 Other long term (current) drug therapy: Secondary | ICD-10-CM

## 2023-04-06 DIAGNOSIS — M351 Other overlap syndromes: Secondary | ICD-10-CM

## 2023-04-06 NOTE — Telephone Encounter (Signed)
Patient contacted the office requesting lab orders to be be release to [facility] [address].   Patient plans to have labs on there right now.

## 2023-04-06 NOTE — Telephone Encounter (Signed)
Lab Orders released.  

## 2023-04-07 LAB — COMPLETE METABOLIC PANEL WITH GFR
AG Ratio: 0.9 (calc) — ABNORMAL LOW (ref 1.0–2.5)
ALT: 34 U/L — ABNORMAL HIGH (ref 6–29)
AST: 31 U/L (ref 10–35)
Albumin: 3.6 g/dL (ref 3.6–5.1)
Alkaline phosphatase (APISO): 74 U/L (ref 37–153)
BUN: 14 mg/dL (ref 7–25)
CO2: 33 mmol/L — ABNORMAL HIGH (ref 20–32)
Calcium: 9 mg/dL (ref 8.6–10.4)
Chloride: 101 mmol/L (ref 98–110)
Creat: 1.01 mg/dL (ref 0.50–1.05)
Globulin: 4 g/dL — ABNORMAL HIGH (ref 1.9–3.7)
Glucose, Bld: 98 mg/dL (ref 65–139)
Potassium: 3.6 mmol/L (ref 3.5–5.3)
Sodium: 140 mmol/L (ref 135–146)
Total Bilirubin: 0.4 mg/dL (ref 0.2–1.2)
Total Protein: 7.6 g/dL (ref 6.1–8.1)
eGFR: 61 mL/min/{1.73_m2} (ref >=60)

## 2023-04-07 LAB — CBC WITH DIFFERENTIAL/PLATELET
Absolute Monocytes: 1264 {cells}/uL — ABNORMAL HIGH (ref 200–950)
Basophils Absolute: 87 {cells}/uL (ref 0–200)
Basophils Relative: 1.1 %
Eosinophils Absolute: 340 {cells}/uL (ref 15–500)
Eosinophils Relative: 4.3 %
HCT: 41.4 % (ref 35.0–45.0)
Hemoglobin: 13.3 g/dL (ref 11.7–15.5)
Lymphs Abs: 2291 {cells}/uL (ref 850–3900)
MCH: 29.3 pg (ref 27.0–33.0)
MCHC: 32.1 g/dL (ref 32.0–36.0)
MCV: 91.2 fL (ref 80.0–100.0)
MPV: 11.8 fL (ref 7.5–12.5)
Monocytes Relative: 16 %
Neutro Abs: 3918 {cells}/uL (ref 1500–7800)
Neutrophils Relative %: 49.6 %
Platelets: 227 10*3/uL (ref 140–400)
RBC: 4.54 10*6/uL (ref 3.80–5.10)
RDW: 13.1 % (ref 11.0–15.0)
Total Lymphocyte: 29 %
WBC: 7.9 10*3/uL (ref 3.8–10.8)

## 2023-04-07 LAB — CK: Total CK: 222 U/L — ABNORMAL HIGH (ref 29–143)

## 2023-04-18 ENCOUNTER — Ambulatory Visit: Payer: BC Managed Care – PPO | Attending: Internal Medicine | Admitting: Internal Medicine

## 2023-04-18 ENCOUNTER — Encounter: Payer: Self-pay | Admitting: Internal Medicine

## 2023-04-18 VITALS — BP 133/86 | HR 80 | Resp 14 | Ht 64.5 in | Wt 218.0 lb

## 2023-04-18 DIAGNOSIS — M351 Other overlap syndromes: Secondary | ICD-10-CM | POA: Diagnosis not present

## 2023-04-18 DIAGNOSIS — J849 Interstitial pulmonary disease, unspecified: Secondary | ICD-10-CM | POA: Diagnosis not present

## 2023-04-18 DIAGNOSIS — Z79899 Other long term (current) drug therapy: Secondary | ICD-10-CM

## 2023-04-18 NOTE — Patient Instructions (Signed)
Azathioprine Tablets What is this medication? AZATHIOPRINE (ay za THYE oh preen) prevents the body from rejecting an organ transplant. It works by lowering the body's immune system response. This helps the body accept the donor organ. It may also be used to treat rheumatoid arthritis. This medicine may be used for other purposes; ask your health care provider or pharmacist if you have questions. COMMON BRAND NAME(S): Azasan, Imuran What should I tell my care team before I take this medication? They need to know if you have any of these conditions: Infection Kidney disease Liver disease An unusual or allergic reaction to azathioprine, lactose, other medications, foods, dyes, or preservatives Pregnant or trying to get pregnant Breastfeeding How should I use this medication? Take this medication by mouth with a full glass of water. Take it as directed on the prescription label at the same time every day. Keep taking it unless your care team tells you to stop. Keep taking it even if you think you are better. Talk to your care team about the use of this medication in children. Special care may be needed. Overdosage: If you think you have taken too much of this medicine contact a poison control center or emergency room at once. NOTE: This medicine is only for you. Do not share this medicine with others. What if I miss a dose? If you miss a dose, take it as soon as you can. If it is almost time for your next dose, take only that dose. Do not take double or extra doses. What may interact with this medication? Do not take this medication with any of the following: Febuxostat Mercaptopurine This medication may also interact with the following: Allopurinol Aminosalicylates, such as sulfasalazine, mesalamine, balsalazide, and olsalazine Leflunomide Medications called ACE inhibitors, such as benazepril, captopril, enalapril, fosinopril, quinapril, lisinopril, ramipril, and  trandolapril Mycophenolate Sulfamethoxazole; trimethoprim Vaccines Warfarin This list may not describe all possible interactions. Give your health care provider a list of all the medicines, herbs, non-prescription drugs, or dietary supplements you use. Also tell them if you smoke, drink alcohol, or use illegal drugs. Some items may interact with your medicine. What should I watch for while using this medication? Visit your care team for regular checks on your progress. You may need blood work done while you are taking this medication. This medication may increase your risk of getting an infection. Call your care team for advice if you get a fever, chills, sore throat, or other symptoms of a cold or flu. Do not treat yourself. Try to avoid being around people who are sick. Talk to your care team about your risk of cancer. You may be more at risk for certain types of cancer if you take this medication. Talk to your care team if you may be pregnant. This medication can cause serious birth defects if taken during pregnancy. This medication may cause infertility. Talk to your care team if you are concerned about your fertility. What side effects may I notice from receiving this medication? Side effects that you should report to your care team as soon as possible: Allergic reactions--skin rash, itching, hives, swelling of the face, lips, tongue, or throat Change in your skin, such as a new growth, a sore that doesn't heal, or a change in a mole Dizziness, loss of balance or coordination, confusion or trouble speaking Infection--fever, chills, cough, sore throat, wounds that don't heal, pain or trouble when passing urine, general feeling of discomfort or being unwell Low red blood cell level--unusual  weakness or fatigue, dizziness, headache, trouble breathing Unusual bruising or bleeding Side effects that usually do not require medical attention (report to your care team if they continue or are  bothersome): Diarrhea Fatigue Nausea Vomiting This list may not describe all possible side effects. Call your doctor for medical advice about side effects. You may report side effects to FDA at 1-800-FDA-1088. Where should I keep my medication? Keep out of the reach of children and pets. Store at room temperature between 15 and 25 degrees C (59 and 77 degrees F). Protect from light. Get rid of any unused medication after the expiration date. To get rid of medications that are no longer needed or have expired: Take the medication to a medication take-back program. Check with your pharmacy or law enforcement to find a location. If you cannot return the medication, check the label or package insert to see if the medication should be thrown out in the garbage or flushed down the toilet. If you are not sure, ask your care team. If it is safe to put it in the trash, empty the medication out of the container. Mix the medication with cat litter, dirt, coffee grounds, or other unwanted substance. Seal the mixture in a bag or container. Put it in the trash. NOTE: This sheet is a summary. It may not cover all possible information. If you have questions about this medicine, talk to your doctor, pharmacist, or health care provider.  2024 Elsevier/Gold Standard (2021-11-24 00:00:00)

## 2023-04-25 LAB — HEPATIC FUNCTION PANEL
AG Ratio: 0.9 (calc) — ABNORMAL LOW (ref 1.0–2.5)
ALT: 32 U/L — ABNORMAL HIGH (ref 6–29)
AST: 29 U/L (ref 10–35)
Albumin: 3.5 g/dL — ABNORMAL LOW (ref 3.6–5.1)
Alkaline phosphatase (APISO): 64 U/L (ref 37–153)
Bilirubin, Direct: 0.1 mg/dL (ref 0.0–0.2)
Globulin: 3.9 g/dL — ABNORMAL HIGH (ref 1.9–3.7)
Indirect Bilirubin: 0.4 mg/dL (ref 0.2–1.2)
Total Bilirubin: 0.5 mg/dL (ref 0.2–1.2)
Total Protein: 7.4 g/dL (ref 6.1–8.1)

## 2023-04-25 LAB — THIOPURINE METHYLTRANSFERASE (TPMT), RBC: Thiopurine Methyltransferase, RBC: 14 nmol/h/mL

## 2023-04-25 LAB — CK: Total CK: 174 U/L — ABNORMAL HIGH (ref 29–143)

## 2023-07-01 ENCOUNTER — Telehealth: Payer: BC Managed Care – PPO | Admitting: Pulmonary Disease

## 2023-07-01 ENCOUNTER — Encounter: Payer: Self-pay | Admitting: Pulmonary Disease

## 2023-07-01 DIAGNOSIS — J849 Interstitial pulmonary disease, unspecified: Secondary | ICD-10-CM | POA: Diagnosis not present

## 2023-07-01 NOTE — Patient Instructions (Signed)
We will schedule you for CT Chest scan in February 2025  Follow up with Rheumatology 07/18/23 to start azathioprine therapy  Follow up in 3 months via video visit

## 2023-07-01 NOTE — Progress Notes (Signed)
Virtual Visit via Video Note  I connected with [name] on 07/01/23 at  2:45 PM EST by a video enabled telemedicine application and verified that I am speaking with the correct person using two identifiers.  Location: Patient: home Provider: clinic   I discussed the limitations of evaluation and management by telemedicine and the availability of in person appointments. The patient expressed understanding and agreed to proceed.  History of Present Illness: Jocelyn Bond is a 67 year old woman, never smoker with hypertension and obesity who returns to pulmonary clinic for bronchiectasis and interstitial lung disease.   She was seen by rheumatology 04/18/23 and could not tolerate cellcept due to nausea/vomiting. There is a plan to transition to azathioprine daily at upcoming appointment on 07/28/23.   Patient overall has been doing well. Denies cough symptoms or any progressive dyspnea. She has bumpy skin rash on hands/arms that appears to be better than before.   Observations/Objective: Middle aged woman, no acute distress Breathing comfortably, talking in full sentences  Assessment and Plan: Interstitial Lung disease in setting of Mixed Connective Tissue Disease  Plan - f/u with rheumatology to start azathioprine therapy - Repeat HRCT Chest in February and PFTs in June of 2025  Follow Up Instructions: F/u in 3 months via video visit   I discussed the assessment and treatment plan with the patient. The patient was provided an opportunity to ask questions and all were answered. The patient agreed with the plan and demonstrated an understanding of the instructions.   The patient was advised to call back or seek an in-person evaluation if the symptoms worsen or if the condition fails to improve as anticipated.  I provided 25 minutes of non-face-to-face time during this encounter.   Martina Sinner, MD

## 2023-07-04 NOTE — Progress Notes (Signed)
 Office Visit Note  Patient: Jocelyn Bond             Date of Birth: 28-Dec-1955           MRN: [id number]             PCP: Joyce Norleen BROCKS, MD Referring: Joyce Norleen BROCKS, MD Visit Date: 07/18/2023   Subjective:  Follow-up  Discussed the use of AI scribe software for clinical note transcription with the patient, who gave verbal consent to proceed.  History of Present Illness   Jocelyn Bond is a 67 y.o. 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   They report no new skin changes and deny any cough or shortness of breath, except for a brief coughing spell when rushing. They have not been sick with any upper respiratory infections recently. They have been trying to increase their physical activity by using a walking mat.  The patient's CK number, a muscle enzyme, has improved since the last visit. They were previously on mycophenolate , but it was discontinued due to adverse effects. The patient's neutrophils have been mildly low on previous CBC. TPMT level of 14 was normal.  Previous HPI 04/18/2023 Jocelyn Bond is a 67 y.o. 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  She started taking mycophenolate  500 mg twice daily initially without problem but for more than a week was experiencing persistent nausea vomiting and diarrhea.  She was not able to appreciate any difference in swelling rashes nodules or any respiratory complaint.  Lab results from October 2 still showing elevated CK and ALT lab test.   Previous HPI 02/03/2023 Jocelyn Bond is a 67 y.o. 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  She has not this any particular changes with joint or muscle pain or weakness.  Still has skin rashes most prominent with raised bumps on both arms  not particularly painful or itchy.  Use of topical steroids was not significantly effective.  She had follow-up in pulmonology clinic with PFTs consistent with mild restrictive and diffusion defect somewhat worse compared to 2022 study.   Previous HPI 12/23/2022 Jocelyn Bond is a 67 y.o. 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  Since our last visit she has not had major change in symptoms.  Skin rash on her arms has changed slightly with some raised bumps as well as the discolored areas mostly around the elbows.  These are not itchy or painful.  She also has a painful area on the right lateral thigh this only hurts on some days.  Does not cause associated hip or knee pain and she does not see any visible swelling.  No radiation down to the foot.  Still sees intermittent discoloration and her fingers but less often with warm weather.  No particular cough or shortness of breath complaint.   Previous HPI 09/21/22 Jocelyn Bond is a 67 y.o. female here for follow up for systemic inflammatory symptoms and abnormal labs associated with interstitial lung disease.  It has been about 2 years since last follow-up she is doing well so did not pursue much additional workup or the recommended skin biopsy.  Repeat lung CT chest shows mild progression compared to 2022 imaging so was recommended probably needing to start treatment for her inflammatory  lung disease.  She continues having mild amount of joint pain and stiffness in several areas but not seeing a lot of visible swelling and not taking any medications for this.  Has not been experiencing any worsening trouble with acid reflux or dysphagia.  She still having issues of sometimes violaceous sometimes erythematous discoloration in her distal fingers with cold exposure.  Not associated with any nail changes or skin lesions.  She had an episode of rash across the back of her neck that resolved mostly on its own did use some  over-the-counter topical cream.   Previous HPI 01/29/21 Jocelyn Bond is a 67 y.o. female here for evaluation for systemic connective tissue disease with positive antibodies and diagnosis of ILD.  She does seem to have a degree of chronic nonproductive cough but denies significant shortness of breath with exertion or at rest.  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  Current coughing has been noticeable since about 1 year ago.  She denies any particular complaints with joint pains or swelling.  She experiences some chronic rash on her distal arms and hands bilaterally that is controlled with topical treatment.  She experiences purple discoloration in the tips of the third finger of each hand with cold exposure that improved after rewarming.  She reports occasional choking on solid foods and having to regurgitate but this is infrequent.  She denies symptomatic acid reflux.  She experiences swelling of the right foot chronically states previous studies have indicated venous insufficiency around the level of the ankle without a particularly identified cause.  She denies any history of blood clots.  She also denies alopecia, oral ulcers, or photosensitive skin rashes.   Labs reviewed 11/2020 ANA 1:1280 cytoplasmic/reticular 1:1280 speckled Scl-70, SSA, SSb neg RF 40 CCP neg MyoMarker 3 Pl-12 weak pos, Ku weak pos, SSA 52kD 47, U1RNP 152, U2RNP moderate pos ANCA neg CK 199   Review of Systems  Constitutional:  Negative for fatigue.  HENT:  Negative for mouth sores and mouth dryness.   Eyes:  Negative for dryness.  Respiratory:  Negative for shortness of breath.   Cardiovascular:  Negative for chest pain and palpitations.  Gastrointestinal:  Negative for blood in stool, constipation and diarrhea.  Endocrine: Negative for increased urination.   Genitourinary:  Negative for involuntary urination.  Musculoskeletal:  Negative for joint pain, gait problem, joint pain, joint swelling, myalgias, muscle weakness, morning stiffness, muscle tenderness and myalgias.  Skin:  Positive for hair loss and sensitivity to sunlight. Negative for color change and rash.  Allergic/Immunologic: Negative for susceptible to infections.  Neurological:  Negative for dizziness and headaches.  Hematological:  Negative for swollen glands.  Psychiatric/Behavioral:  Negative for depressed mood and sleep disturbance. The patient is not nervous/anxious.     PMFS History:  Patient Active Problem List   Diagnosis Date Noted   MCTD (mixed connective tissue disease) (HCC) 12/23/2022   High frequency hearing loss 06/30/2021   Aortic atherosclerosis (HCC) 06/30/2021   Positive ANA (antinuclear antibody) 01/29/2021   ILD (interstitial lung disease) (HCC) 01/29/2021   High risk medication use 01/29/2021   Seborrheic keratosis 06/20/2019   Lymphadenopathy of head and neck 02/16/2016   Chronic venous insufficiency 12/11/2012   Obesity (BMI 30-39.9) 12/11/2012   Essential hypertension 01/09/2008    Past Medical History:  Diagnosis Date   Allergy    very rare  Hypertension    ILD (interstitial lung disease) (HCC)    Obesity     Family History  Problem Relation Age of Onset   Hypertension Mother    Hypertension Father    Hypertension Sister    Cancer Sister        breast ca   Hypertension Sister    Arthritis/Rheumatoid Sister    Hypertension Sister    Fibromyalgia Sister    Hypertension Sister    Healthy Son    Healthy Daughter    Healthy Daughter    Colon cancer Neg Hx    Colon polyps Neg Hx    Esophageal cancer Neg Hx    Rectal cancer Neg Hx    Stomach cancer Neg Hx    Past Surgical History:  Procedure Laterality Date   BTL     BUNIONECTOMY     COLONOSCOPY  2009   13 yrs ago Margarete- pt states removed polyps , no report, no path    LUNG  BIOPSY  2009   TUBAL LIGATION     Social History   Social History Narrative   Not on file   Immunization History  Administered Date(s) Administered   Fluad Quad(high Dose 65+) 02/19/2022   Influenza,inj,Quad PF,6+ Mos 04/10/2018, 06/12/2020, 04/13/2021   Influenza-Unspecified 05/05/2016   Moderna Covid-19 Fall Seasonal Vaccine 65yrs & older 04/24/2023   Moderna Covid-19 Vaccine Bivalent Booster 78yrs & up 02/13/2021   Moderna Sars-Covid-2 Vaccination 09/13/2019, 10/16/2019, 06/09/2020   PNEUMOCOCCAL CONJUGATE-20 06/30/2021   Pfizer Covid-19 Vaccine Bivalent Booster 17yrs & up 02/19/2022   Respiratory Syncytial Virus Vaccine,Recomb Aduvanted(Arexvy) 07/16/2022   Tdap 08/06/2010, 04/13/2021   Zoster Recombinant(Shingrix ) 04/10/2018, 07/11/2018     Objective: Vital Signs: BP 128/87 (BP Location: Left Arm, Patient Position: Sitting, Cuff Size: Normal)   Pulse 92   Resp 12   Ht 5' 4.5 (1.638 m)   Wt 223 lb (101.2 kg)   BMI 37.69 kg/m    Physical Exam HENT:     Mouth/Throat:     Mouth: Mucous membranes are moist.     Pharynx: Oropharynx is clear.  Eyes:     Conjunctiva/sclera: Conjunctivae normal.  Cardiovascular:     Rate and Rhythm: Normal rate and regular rhythm.  Pulmonary:     Effort: Pulmonary effort is normal.     Comments: Crackles over right lower lung field Musculoskeletal:     Right lower leg: No edema.     Left lower leg: No edema.  Skin:    Comments: Hyperpigmentation on hands and arms dorsal surfaces Mild digital clubbing      Musculoskeletal Exam:  Shoulders full ROM no tenderness or swelling Elbows full ROM no tenderness or swelling Wrists full ROM no tenderness or swelling Fingers full ROM no tenderness or swelling Knees full ROM no tenderness or swelling   Investigation: No additional findings.  Imaging: No results found.  Recent Labs: Lab Results  Component Value Date   WBC 7.9 04/06/2023   HGB 13.3 04/06/2023   PLT 227 04/06/2023    NA 140 04/06/2023   K 3.6 04/06/2023   CL 101 04/06/2023   CO2 33 (H) 04/06/2023   GLUCOSE 98 04/06/2023   BUN 14 04/06/2023   CREATININE 1.01 04/06/2023   BILITOT 0.5 04/18/2023   ALKPHOS 74 07/14/2022   AST 29 04/18/2023   ALT 32 (H) 04/18/2023   PROT 7.4 04/18/2023   ALBUMIN 3.7 (L) 07/14/2022   CALCIUM  9.0 04/06/2023   GFRAA 61 06/23/2020  QFTBGOLDPLUS NEGATIVE 01/29/2021    Speciality Comments: No specialty comments available.  Procedures:  No procedures performed Allergies: Patient has no known allergies.   Assessment / Plan:     Visit Diagnoses: MCTD (mixed connective tissue disease) (HCC) vs DM Previous intolerance to mycophenolate . Discussed starting azathioprine  (Imuran ) to potentially help with lung, skin, and muscle inflammation. Noted patient's slightly low neutrophil count, which will need monitoring due to potential for azathioprine  to further lower white blood cell count. -Start azathioprine  50mg  once daily. -Obtain patient's consent for azathioprine , discussing potential side effects including infections, toxicity, malignancy with long term use and need for regular monitoring.  Interstitial Lung Disease CK levels improved, indicating reduced muscle inflammation. Mild cough when rushing, but no significant shortness of breath. Clubbing of fingers noted, a common feature in lung disease. CT scan scheduled for early February to assess for progression. -treatment plan as above -Continue pulmonology follow-up for serial assessments.  High risk medication use - If labs okay would start azathioprine  at 50 mg once daily with plan to titrate. - Plan: CBC with Differential/Platelet, COMPLETE METABOLIC PANEL WITH GFR -Check blood counts 1 month after starting azathioprine .  Skin Dryness No new areas of concern. Dryness managed with frequent lotion application. -Continue current skin care regimen.   Orders: Orders Placed This Encounter  Procedures   CBC with  Differential/Platelet   COMPLETE METABOLIC PANEL WITH GFR   Meds ordered this encounter  Medications   azaTHIOprine  (IMURAN ) 50 MG tablet    Sig: Take 1 tablet (50 mg total) by mouth daily.    Dispense:  30 tablet    Refill:  1     Follow-Up Instructions: Return in about 3 months (around 10/16/2023) for DM/ILD AZA start f/u 3mos.   Lonni LELON Ester, MD  Note - This record has been created using [employer].  Chart creation errors have been sought, but may not always  have been located. Such creation errors do not reflect on  the standard of medical care.

## 2023-07-18 ENCOUNTER — Ambulatory Visit: Payer: 59 | Attending: Internal Medicine | Admitting: Internal Medicine

## 2023-07-18 ENCOUNTER — Encounter: Payer: Self-pay | Admitting: Internal Medicine

## 2023-07-18 VITALS — BP 128/87 | HR 92 | Resp 12 | Ht 64.5 in | Wt 223.0 lb

## 2023-07-18 DIAGNOSIS — J849 Interstitial pulmonary disease, unspecified: Secondary | ICD-10-CM | POA: Diagnosis not present

## 2023-07-18 DIAGNOSIS — M351 Other overlap syndromes: Secondary | ICD-10-CM | POA: Diagnosis not present

## 2023-07-18 DIAGNOSIS — Z79899 Other long term (current) drug therapy: Secondary | ICD-10-CM

## 2023-07-18 MED ORDER — AZATHIOPRINE 50 MG PO TABS: 50.0000 mg | ORAL_TABLET | Freq: Every day | ORAL | 1 refills | Status: DC

## 2023-07-18 NOTE — Patient Instructions (Signed)
Azathioprine Tablets What is this medication? AZATHIOPRINE (ay za THYE oh preen) prevents the body from rejecting an organ transplant. It works by lowering the body's immune system response. This helps the body accept the donor organ. It may also be used to treat rheumatoid arthritis. This medicine may be used for other purposes; ask your health care provider or pharmacist if you have questions. COMMON BRAND NAME(S): Azasan, Imuran What should I tell my care team before I take this medication? They need to know if you have any of these conditions: Infection Kidney disease Liver disease An unusual or allergic reaction to azathioprine, lactose, other medications, foods, dyes, or preservatives Pregnant or trying to get pregnant Breastfeeding How should I use this medication? Take this medication by mouth with a full glass of water. Take it as directed on the prescription label at the same time every day. Keep taking it unless your care team tells you to stop. Keep taking it even if you think you are better. Talk to your care team about the use of this medication in children. Special care may be needed. Overdosage: If you think you have taken too much of this medicine contact a poison control center or emergency room at once. NOTE: This medicine is only for you. Do not share this medicine with others. What if I miss a dose? If you miss a dose, take it as soon as you can. If it is almost time for your next dose, take only that dose. Do not take double or extra doses. What may interact with this medication? Do not take this medication with any of the following: Febuxostat Mercaptopurine This medication may also interact with the following: Allopurinol Aminosalicylates, such as sulfasalazine, mesalamine, balsalazide, and olsalazine Leflunomide Medications called ACE inhibitors, such as benazepril, captopril, enalapril, fosinopril, quinapril, lisinopril, ramipril, and  trandolapril Mycophenolate Sulfamethoxazole; trimethoprim Vaccines Warfarin This list may not describe all possible interactions. Give your health care provider a list of all the medicines, herbs, non-prescription drugs, or dietary supplements you use. Also tell them if you smoke, drink alcohol, or use illegal drugs. Some items may interact with your medicine. What should I watch for while using this medication? Visit your care team for regular checks on your progress. You may need blood work done while you are taking this medication. This medication may increase your risk of getting an infection. Call your care team for advice if you get a fever, chills, sore throat, or other symptoms of a cold or flu. Do not treat yourself. Try to avoid being around people who are sick. Talk to your care team about your risk of cancer. You may be more at risk for certain types of cancer if you take this medication. Talk to your care team if you may be pregnant. This medication can cause serious birth defects if taken during pregnancy. This medication may cause infertility. Talk to your care team if you are concerned about your fertility. What side effects may I notice from receiving this medication? Side effects that you should report to your care team as soon as possible: Allergic reactions--skin rash, itching, hives, swelling of the face, lips, tongue, or throat Change in your skin, such as a new growth, a sore that doesn't heal, or a change in a mole Dizziness, loss of balance or coordination, confusion or trouble speaking Infection--fever, chills, cough, sore throat, wounds that don't heal, pain or trouble when passing urine, general feeling of discomfort or being unwell Low red blood cell level--unusual  weakness or fatigue, dizziness, headache, trouble breathing Unusual bruising or bleeding Side effects that usually do not require medical attention (report to your care team if they continue or are  bothersome): Diarrhea Fatigue Nausea Vomiting This list may not describe all possible side effects. Call your doctor for medical advice about side effects. You may report side effects to FDA at 1-800-FDA-1088. Where should I keep my medication? Keep out of the reach of children and pets. Store at room temperature between 15 and 25 degrees C (59 and 77 degrees F). Protect from light. Get rid of any unused medication after the expiration date. To get rid of medications that are no longer needed or have expired: Take the medication to a medication take-back program. Check with your pharmacy or law enforcement to find a location. If you cannot return the medication, check the label or package insert to see if the medication should be thrown out in the garbage or flushed down the toilet. If you are not sure, ask your care team. If it is safe to put it in the trash, empty the medication out of the container. Mix the medication with cat litter, dirt, coffee grounds, or other unwanted substance. Seal the mixture in a bag or container. Put it in the trash. NOTE: This sheet is a summary. It may not cover all possible information. If you have questions about this medicine, talk to your doctor, pharmacist, or health care provider.  2024 Elsevier/Gold Standard (2021-11-24 00:00:00)

## 2023-07-22 ENCOUNTER — Other Ambulatory Visit: Payer: Self-pay | Admitting: Family Medicine

## 2023-07-22 DIAGNOSIS — I10 Essential (primary) hypertension: Secondary | ICD-10-CM

## 2023-07-22 NOTE — Telephone Encounter (Signed)
Sent pt. Message to schedule physical exam or med check been a year since last appt.

## 2023-07-28 ENCOUNTER — Encounter: Payer: Self-pay | Admitting: Family Medicine

## 2023-07-28 ENCOUNTER — Ambulatory Visit (INDEPENDENT_AMBULATORY_CARE_PROVIDER_SITE_OTHER): Payer: 59 | Admitting: Family Medicine

## 2023-07-28 VITALS — BP 132/84 | HR 87 | Ht 64.5 in | Wt 225.8 lb

## 2023-07-28 DIAGNOSIS — M351 Other overlap syndromes: Secondary | ICD-10-CM

## 2023-07-28 DIAGNOSIS — Z79899 Other long term (current) drug therapy: Secondary | ICD-10-CM | POA: Diagnosis not present

## 2023-07-28 DIAGNOSIS — I10 Essential (primary) hypertension: Secondary | ICD-10-CM

## 2023-07-28 DIAGNOSIS — J849 Interstitial pulmonary disease, unspecified: Secondary | ICD-10-CM

## 2023-07-28 DIAGNOSIS — I872 Venous insufficiency (chronic) (peripheral): Secondary | ICD-10-CM | POA: Diagnosis not present

## 2023-07-28 DIAGNOSIS — E669 Obesity, unspecified: Secondary | ICD-10-CM

## 2023-07-28 DIAGNOSIS — I700 Atherosclerosis of aorta: Secondary | ICD-10-CM

## 2023-07-28 NOTE — Progress Notes (Signed)
   Subjective:    Patient ID: Jocelyn Bond, female    DOB: 04/15/1956, 68 y.o.   MRN: [id number]  HPI She is here for an interval evaluation.  She continues to be followed by pulmonary as well as by rheumatology.  Recently she was placed on Imuran.  She does have some slight peripheral edema but also did miss several doses of her blood pressure medication.  She continues on Crestor and having no difficulty with that.  She is now retired and is taking care of her grandchildren.  She does not smoke or drink.  Her physical activity level is limited   Review of Systems     Objective:    Physical Exam Alert and in no distress. Tympanic membranes and canals are normal. Pharyngeal area is normal. Neck is supple without adenopathy or thyromegaly. Cardiac exam shows a regular sinus rhythm without murmurs or gallops. Lungs are clear to auscultation. Her blood work was reviewed. She is up-to-date on her shots.      Assessment & Plan:  MCTD (mixed connective tissue disease) (HCC)  Chronic venous insufficiency  Essential hypertension  High risk medication use  ILD (interstitial lung disease) (HCC)  Obesity (BMI 30-39.9)  Aortic atherosclerosis (HCC) - Plan: Lipid panel

## 2023-07-29 ENCOUNTER — Encounter: Payer: Self-pay | Admitting: Family Medicine

## 2023-07-29 LAB — LIPID PANEL
Chol/HDL Ratio: 2.8 {ratio} (ref 0.0–4.4)
Cholesterol, Total: 172 mg/dL (ref 100–199)
HDL: 62 mg/dL (ref >39)
LDL Chol Calc (NIH): 95 mg/dL (ref 0–99)
Triglycerides: 78 mg/dL (ref 0–149)
VLDL Cholesterol Cal: 15 mg/dL (ref 5–40)

## 2023-07-29 MED ORDER — ROSUVASTATIN CALCIUM 40 MG PO TABS: 40.0000 mg | ORAL_TABLET | Freq: Every day | ORAL | 3 refills | Status: AC

## 2023-07-29 NOTE — Addendum Note (Signed)
Addended by: Ronnald Nian on: 07/29/2023 12:34 PM   Modules accepted: Orders

## 2023-08-11 ENCOUNTER — Ambulatory Visit (HOSPITAL_COMMUNITY)
Admission: RE | Admit: 2023-08-11 | Discharge: 2023-08-11 | Disposition: A | Discharge status: Home / Self Care | Payer: 59 | Source: Ambulatory Visit | Attending: Pulmonary Disease | Admitting: Pulmonary Disease

## 2023-08-11 DIAGNOSIS — J849 Interstitial pulmonary disease, unspecified: Secondary | ICD-10-CM | POA: Insufficient documentation

## 2023-08-30 ENCOUNTER — Encounter: Payer: Self-pay | Admitting: Internal Medicine

## 2023-09-06 ENCOUNTER — Other Ambulatory Visit: Payer: Self-pay | Admitting: Family Medicine

## 2023-09-06 DIAGNOSIS — E785 Hyperlipidemia, unspecified: Secondary | ICD-10-CM

## 2023-09-12 IMAGING — RF DG ESOPHAGUS
9 of 13 series · 15 of 22 positions shown · non-contrast
Comparison: None.

CLINICAL DATA: 65-year-old female with history of dysphagia.

EXAM:
ESOPHOGRAM / BARIUM SWALLOW / BARIUM TABLET STUDY
TECHNIQUE: Combined double contrast and single contrast examination performed
using effervescent crystals, thick barium liquid, and thin barium
liquid. The patient was observed with fluoroscopy swallowing a 13 mm
barium sulphate tablet.
FLUOROSCOPY TIME:  Fluoroscopy Time:  1 minute and 12 seconds
Radiation Exposure Index (if provided by the fluoroscopic device):
17.0 mGy

[Series 1: fluoro_barium 2fps_bw · 0.17mm/px · 1 of 1 slices shown (1 of 5)]
[im 1/1]
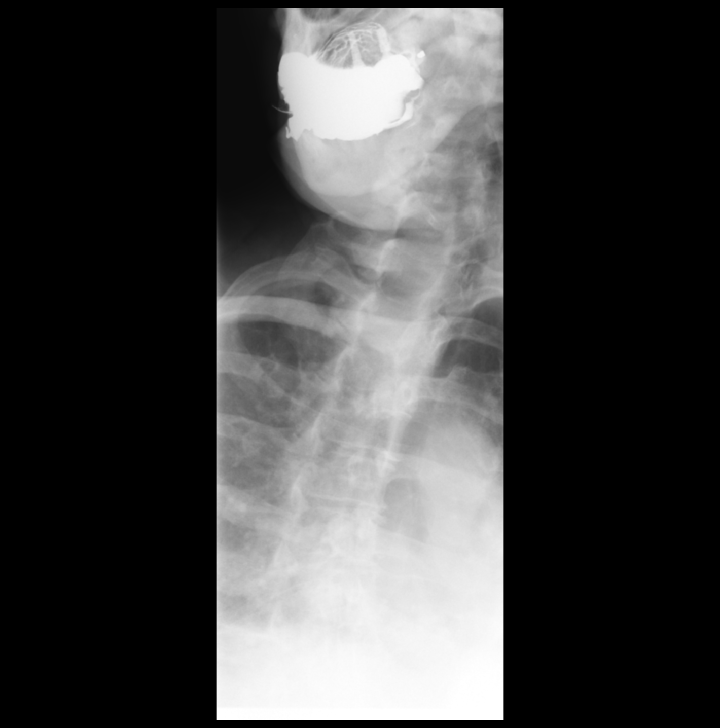

[Series 3: fluoro_barium 2fps_bw · 0.17mm/px · 1 of 1 slices shown (2 of 5)]
[im 1/1]
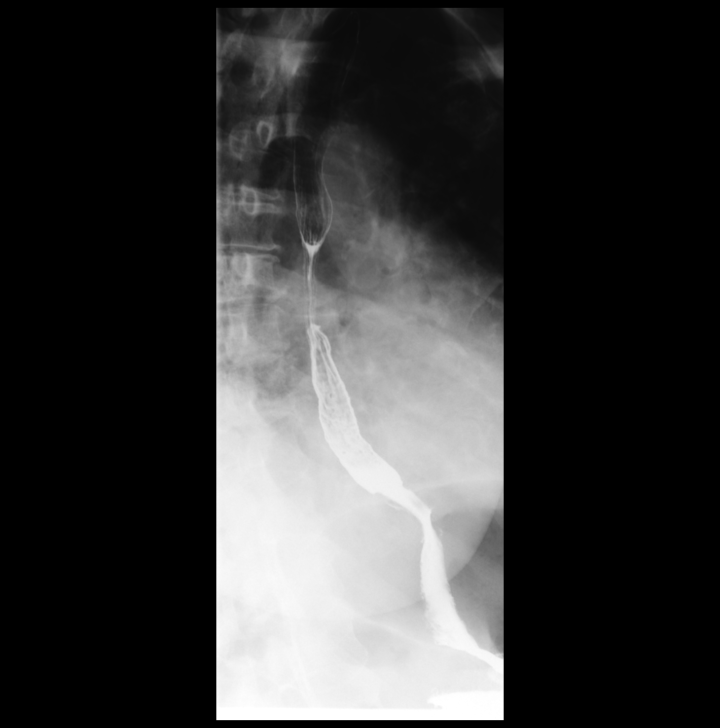

[Series 4: fluoro_barium 2fps_bw · 0.17mm/px · 1 of 1 slices shown (3 of 5)]
[im 1/1]
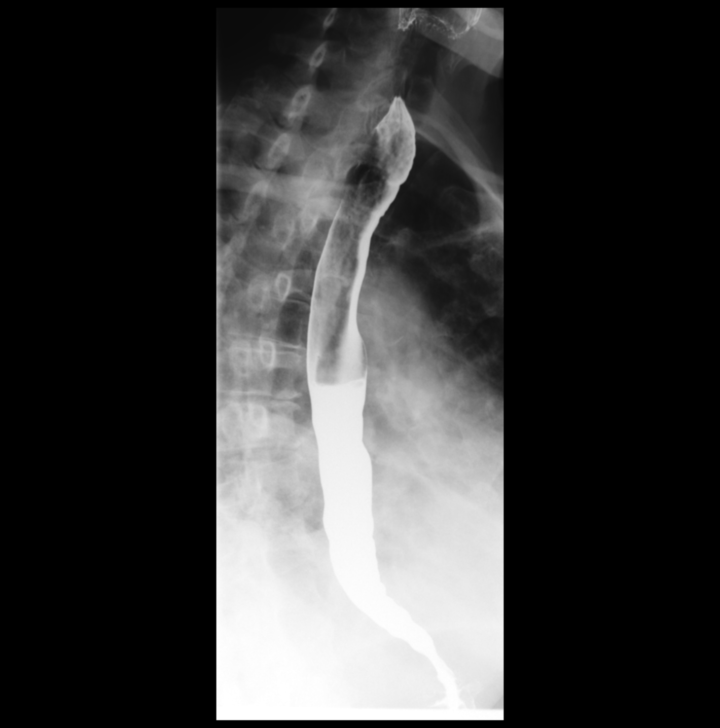

[Series 6: cp_standard · 0.52mm/px · 3 of 27 frames shown (1 of 4)]
[frame 2/27]
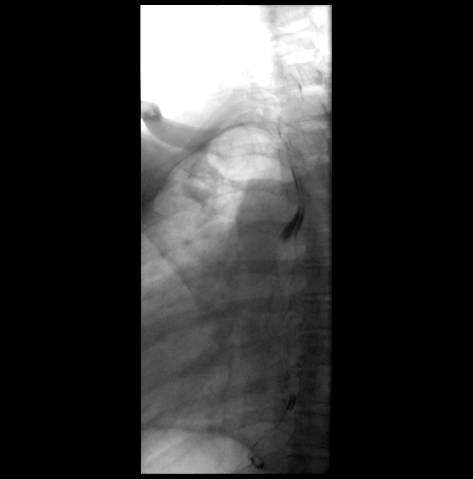
[frame 5/27]
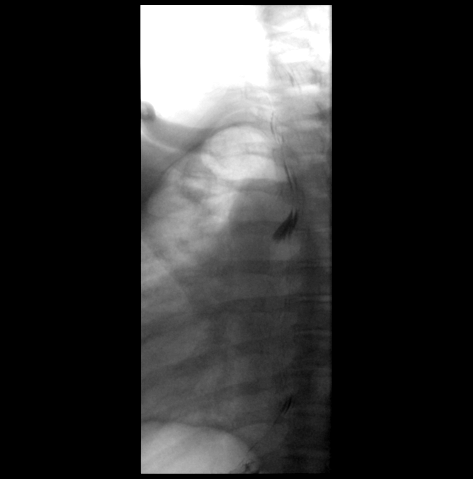
[frame 23/27]
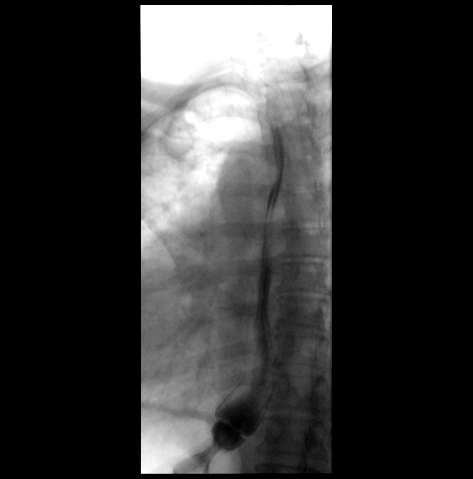

[Series 7: cp_standard · 0.51mm/px · 3 of 27 frames shown (2 of 4)]
[frame 5/27]
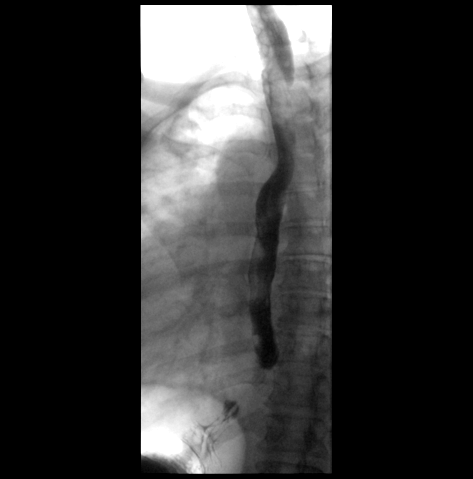
[frame 23/27]
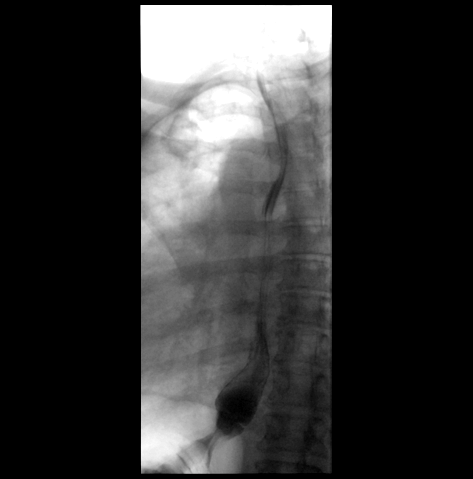
[frame 24/27]
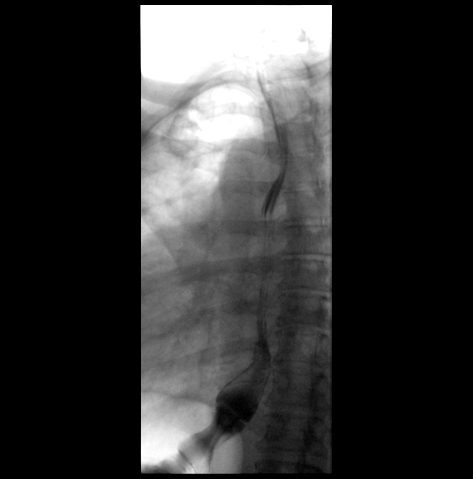

[Series 8: fluoro_barium 2fps_bw · 0.17mm/px · 1 of 1 slices shown (4 of 5)]
[im 1/1]
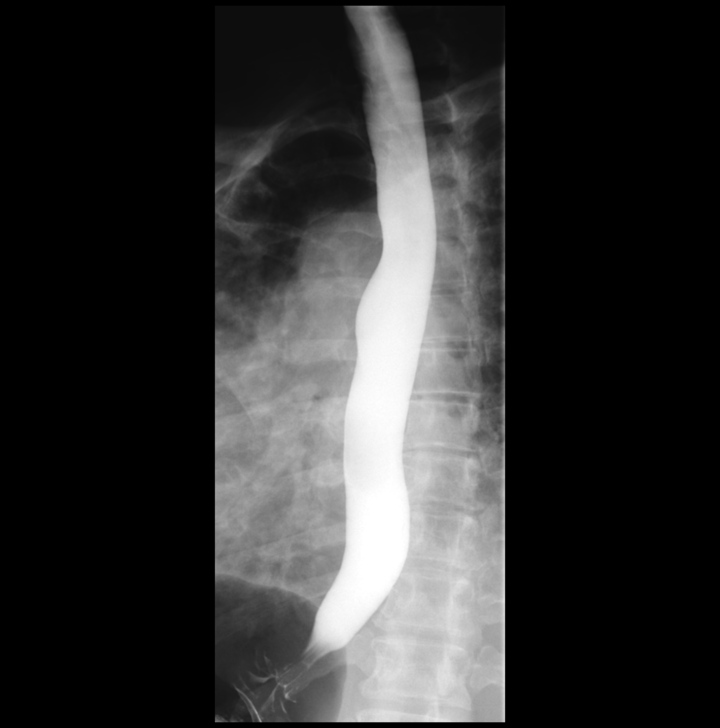

[Series 10: fluoro_barium 2fps_bw · 0.17mm/px · 1 of 1 slices shown (5 of 5)]
[im 1/1]
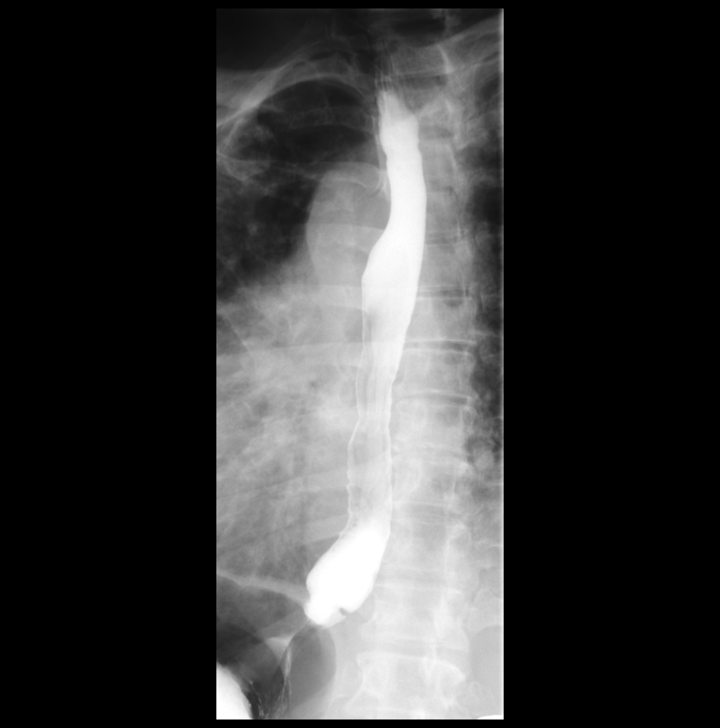

[Series 11: cp_standard · 0.52mm/px · 3 of 25 frames shown (3 of 4)]
[frame 4/25]
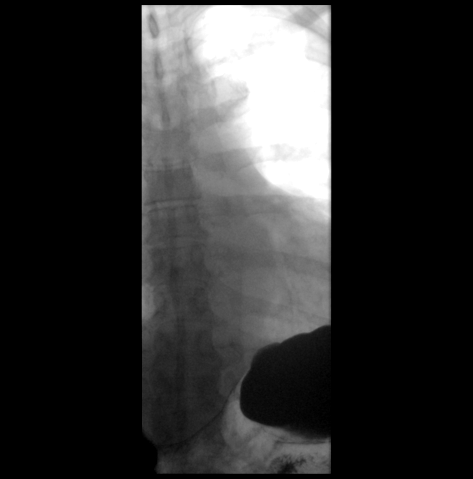
[frame 21/25]
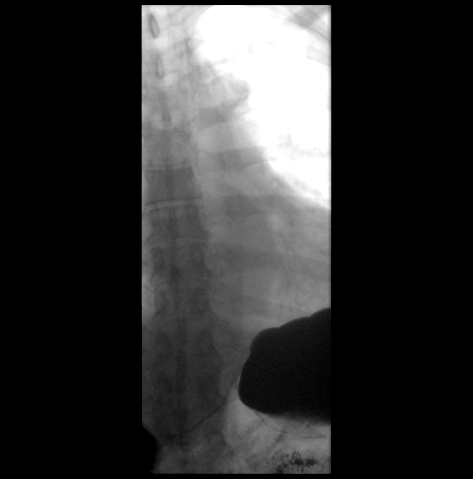
[frame 22/25]
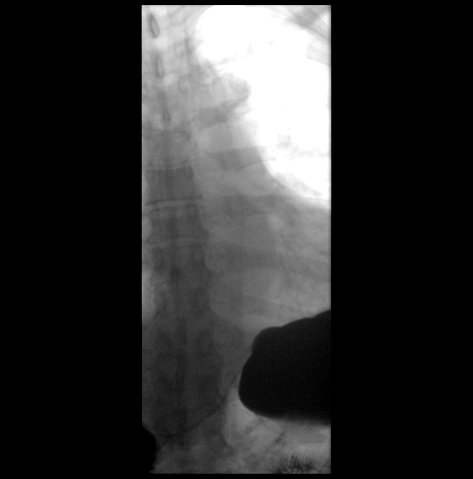

[Series 13: cp_standard · 0.26mm/px · 1 of 1 slices shown (4 of 4)]
[im 1/1]
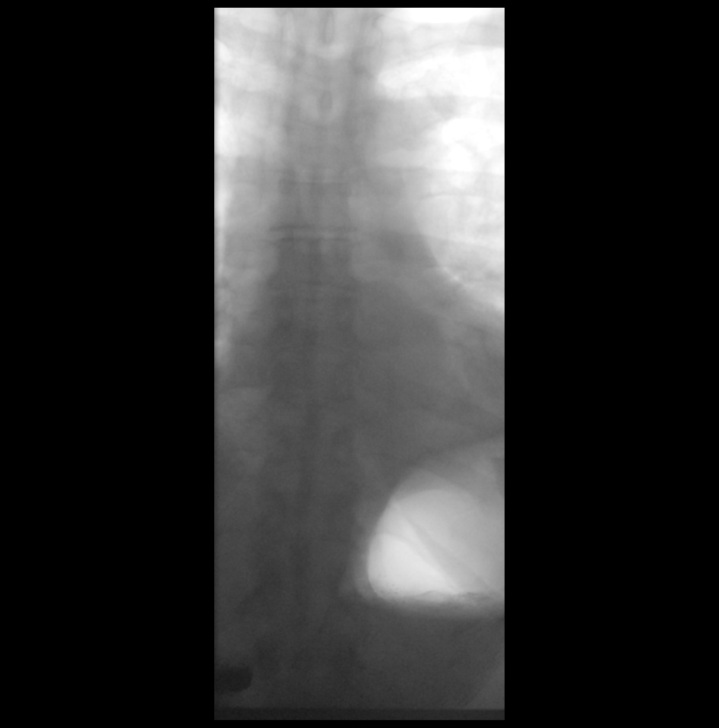

[15 of 22 positions shown; findings below may reference images not displayed]

FINDINGS: Double contrast images demonstrated a normal appearance of the
esophageal mucosa. Multiple single swallow attempts were observed,
which demonstrated normal esophageal motility. Full column
esophagram demonstrated no esophageal mass, stricture or esophageal
ring. Small hiatal hernia. Water siphon test demonstrated no
gastroesophageal reflux. A barium tablet was administered, which
passed readily into the stomach.
IMPRESSION: 1. Small hiatal hernia. Otherwise, normal esophagram, as detailed
above.

## 2023-09-15 ENCOUNTER — Telehealth: Payer: Self-pay | Admitting: *Deleted

## 2023-09-15 NOTE — Telephone Encounter (Signed)
 Patient contacted the office and requested a call back about question her medication. Patient states she started the Imuran in January. Patient states she stopped taking it because she started having vomiting. Patient states she was not having the vomiting when she first started the medication. Patient states she has vomited on 07/26/2023, 07/31/2023, 08/13/2023, 08/16/2023, 09/03/2023 and 09/07/2023. Please advise.

## 2023-09-18 ENCOUNTER — Other Ambulatory Visit: Payer: Self-pay | Admitting: Internal Medicine

## 2023-09-19 NOTE — Telephone Encounter (Signed)
 Last Fill: 07/18/2023  Labs: 04/06/2023 Absolute Monocytes 1,264, CO2 33, Globulin 4.0, AG Ratio 0.9, ALT 34   Next Visit: 10/17/2023  Last Visit: 07/18/2023   DX: MCTD (mixed connective tissue disease)   Current Dose per office note 07/18/2023: azathioprine at 50 mg once daily   Okay to refill Imuran?

## 2023-09-22 NOTE — Telephone Encounter (Signed)
 Patient contacted the office and inquired if it was possible to just reduce the dose of Azathioprine. Patient inquires if the plan is to just discontinue the medication. Patient inquires what the next step would be. Please advise.

## 2023-09-23 ENCOUNTER — Other Ambulatory Visit: Payer: Self-pay

## 2023-09-23 NOTE — Telephone Encounter (Signed)
 The 50 mg tablet once daily is already very low dose of azathioprine for her.  We would be better off just discontinuing completely and discussing an alternative medicine option when we follow-up.

## 2023-09-23 NOTE — Telephone Encounter (Signed)
 Patient advised The 50 mg tablet once daily is already very low dose of azathioprine for her.  We would be better off just discontinuing completely and discussing an alternative medicine option when we follow-up. Patient verbalized understanding.

## 2023-09-23 NOTE — Progress Notes (Unsigned)
 Per Dr. Dimple Casey, okay to discontinue.

## 2023-10-03 NOTE — Progress Notes (Signed)
 Office Visit Note  Patient: Jocelyn Bond             Date of Birth: 05-Sep-1955           MRN: [id number]             PCP: Watson Hacking, MD Referring: Watson Hacking, MD Visit Date: 10/17/2023   Subjective:  Follow-up    Discussed the use of AI scribe software for clinical note transcription with the patient, who gave verbal consent to proceed.  History of Present Illness   Jocelyn Bond is a 67 y.o. 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    She is experiencing ongoing issues related to her connective tissue disease, primarily concerning her lung condition. A CT scan from February showed a mild increase in scarring compared to previous imaging. Blood tests have shown high CK levels.  She has had difficulty tolerating medications prescribed for her condition. She initially tried Imuran , which caused significant gastrointestinal distress, including vomiting. After discontinuing Imuran , her vomiting resolved. She also tried CellCept  (mycophenolate ), which similarly caused gastrointestinal issues, leading to vomiting. Both medications were not well tolerated, and she is currently not on these treatments.  She experiences Raynaud's symptoms, such as her fingers turning white when exposed to cold, indicating circulation issues related to her connective tissue disease. No joint pain or stiffness. No skin issues on her hands or feet. No discoloration or other significant changes in her extremities.       Previous HPI 07/18/2023 Jocelyn Bond is a 68 y.o. 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   They report no new skin changes and deny any cough or shortness of breath, except for a brief coughing spell when rushing. They have not been sick with any upper respiratory infections recently. They have been trying to increase their physical activity by using a walking mat.    The patient's CK number, a muscle enzyme, has improved since the last visit. They were previously on mycophenolate , but it was discontinued due to adverse effects. The patient's neutrophils have been mildly low on previous CBC. TPMT level of 14 was normal.   Previous HPI 04/18/2023 Jocelyn Bond is a 68 y.o. 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  She started taking mycophenolate  500 mg twice daily initially without problem but for more than a week was experiencing persistent nausea vomiting and diarrhea.  She was not able to appreciate any difference in swelling rashes nodules or any respiratory complaint.  Lab results from October 2 still showing elevated CK and ALT lab test.   Previous HPI 02/03/2023 Jocelyn Bond is a 68 y.o. 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  She has not this any particular changes with joint or muscle pain or weakness.  Still has skin rashes most prominent with raised bumps on both arms not particularly painful or itchy.  Use of topical steroids was not significantly effective.  She had follow-up in pulmonology clinic with PFTs consistent with mild restrictive and diffusion defect somewhat worse compared to 2022 study.   Previous HPI 12/23/2022 Jocelyn Bond is a 68 y.o. 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  Since our last visit she has  not had major change in symptoms.  Skin rash on her arms has changed slightly with some raised bumps as well as the discolored areas mostly around the elbows.  These are not itchy or painful.  She also has a painful area on the right lateral thigh this only hurts on some days.  Does not cause associated hip or knee pain and she does not see any visible swelling.  No radiation down to the foot.  Still  sees intermittent discoloration and her fingers but less often with warm weather.  No particular cough or shortness of breath complaint.   Previous HPI 09/21/22 Jocelyn Bond is a 69 y.o. female here for follow up for systemic inflammatory symptoms and abnormal labs associated with interstitial lung disease.  It has been about 2 years since last follow-up she is doing well so did not pursue much additional workup or the recommended skin biopsy.  Repeat lung CT chest shows mild progression compared to 2022 imaging so was recommended probably needing to start treatment for her inflammatory lung disease.  She continues having mild amount of joint pain and stiffness in several areas but not seeing a lot of visible swelling and not taking any medications for this.  Has not been experiencing any worsening trouble with acid reflux or dysphagia.  She still having issues of sometimes violaceous sometimes erythematous discoloration in her distal fingers with cold exposure.  Not associated with any nail changes or skin lesions.  She had an episode of rash across the back of her neck that resolved mostly on its own did use some over-the-counter topical cream.   Previous HPI 01/29/21 Jocelyn Bond is a 68 y.o. female here for evaluation for systemic connective tissue disease with positive antibodies and diagnosis of ILD.  She does seem to have a degree of chronic nonproductive cough but denies significant shortness of breath with exertion or at rest.  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  Current coughing has been noticeable since about 1 year ago.  She denies any particular complaints with joint pains or swelling.  She experiences some chronic rash on her distal arms and hands bilaterally that is controlled with topical treatment.  She experiences purple  discoloration in the tips of the third finger of each hand with cold exposure that improved after rewarming.  She reports occasional choking on solid foods and having to regurgitate but this is infrequent.  She denies symptomatic acid reflux.  She experiences swelling of the right foot chronically states previous studies have indicated venous insufficiency around the level of the ankle without a particularly identified cause.  She denies any history of blood clots.  She also denies alopecia, oral ulcers, or photosensitive skin rashes.   Labs reviewed 11/2020 ANA 1:1280 cytoplasmic/reticular 1:1280 speckled Scl-70, SSA, SSb neg RF 40 CCP neg MyoMarker 3 Pl-12 weak pos, Ku weak pos, SSA 52kD 47, U1RNP 152, U2RNP moderate pos ANCA neg CK 199   Review of Systems  Constitutional:  Negative for fatigue.  HENT:  Positive for mouth dryness. Negative for mouth sores.   Eyes:  Negative for dryness.  Respiratory:  Negative for shortness of breath.   Cardiovascular:  Negative for chest pain and palpitations.  Gastrointestinal:  Positive for constipation. Negative for blood in stool and diarrhea.  Endocrine: Negative for increased urination.  Genitourinary:  Negative for involuntary urination.  Musculoskeletal:  Positive  for joint swelling. Negative for joint pain, gait problem, joint pain, myalgias, muscle weakness, morning stiffness, muscle tenderness and myalgias.  Skin:  Positive for sensitivity to sunlight. Negative for color change, rash and hair loss.  Allergic/Immunologic: Negative for susceptible to infections.  Neurological:  Negative for dizziness and headaches.  Hematological:  Negative for swollen glands.  Psychiatric/Behavioral:  Negative for depressed mood and sleep disturbance. The patient is not nervous/anxious.     PMFS History:  Patient Active Problem List   Diagnosis Date Noted   MCTD (mixed connective tissue disease) (HCC) 12/23/2022   High frequency hearing loss 06/30/2021    Aortic atherosclerosis (HCC) 06/30/2021   Positive ANA (antinuclear antibody) 01/29/2021   ILD (interstitial lung disease) (HCC) 01/29/2021   High risk medication use 01/29/2021   Seborrheic keratosis 06/20/2019   Lymphadenopathy of head and neck 02/16/2016   Chronic venous insufficiency 12/11/2012   Obesity (BMI 30-39.9) 12/11/2012   Essential hypertension 01/09/2008    Past Medical History:  Diagnosis Date   Allergy    very rare    Hypertension    ILD (interstitial lung disease) (HCC)    Obesity     Family History  Problem Relation Age of Onset   Hypertension Mother    Hypertension Father    Hypertension Sister    Cancer Sister        breast ca   Hypertension Sister    Arthritis/Rheumatoid Sister    Hypertension Sister    Fibromyalgia Sister    Hypertension Sister    Healthy Son    Healthy Daughter    Healthy Daughter    Colon cancer Neg Hx    Colon polyps Neg Hx    Esophageal cancer Neg Hx    Rectal cancer Neg Hx    Stomach cancer Neg Hx    Past Surgical History:  Procedure Laterality Date   BTL     BUNIONECTOMY     COLONOSCOPY  2009   13 yrs ago [facility] Core- pt states removed polyps , no report, no path    LUNG BIOPSY  2009   TUBAL LIGATION     Social History   Social History Narrative   Not on file   Immunization History  Administered Date(s) Administered   Fluad Quad(high Dose 65+) 02/19/2022   Influenza,inj,Quad PF,6+ Mos 04/10/2018, 06/12/2020, 04/13/2021   Influenza-Unspecified 05/05/2016   Moderna Covid-19 Fall Seasonal Vaccine 66yrs & older 04/24/2023   Moderna Covid-19 Vaccine Bivalent Booster 17yrs & up 02/13/2021   Moderna Sars-Covid-2 Vaccination 09/13/2019, 10/16/2019, 06/09/2020   PNEUMOCOCCAL CONJUGATE-20 06/30/2021   Pfizer Covid-19 Vaccine Bivalent Booster 41yrs & up 02/19/2022   Respiratory Syncytial Virus Vaccine,Recomb Aduvanted(Arexvy) 07/16/2022   Tdap 08/06/2010, 04/13/2021   Zoster Recombinant(Shingrix ) 04/10/2018, 07/11/2018      Objective: Vital Signs: BP 130/83 (BP Location: Left Arm, Patient Position: Sitting, Cuff Size: Large)   Pulse (!) 103   Resp 14   Ht 5' 5.5" (1.664 m)   Wt 226 lb (102.5 kg)   BMI 37.04 kg/m    Physical Exam Eyes:     Conjunctiva/sclera: Conjunctivae normal.  Cardiovascular:     Rate and Rhythm: Normal rate and regular rhythm.  Pulmonary:     Effort: Pulmonary effort is normal.     Comments: Faint inspiratory crackles over right lower lung field Musculoskeletal:     Comments: Faint digital clubbing No digital pitting or other nail changes  Lymphadenopathy:     Cervical: No cervical adenopathy.  Skin:  General: Skin is warm and dry.  Neurological:     Mental Status: She is alert.  Psychiatric:        Mood and Affect: Mood normal.      Musculoskeletal Exam:  Neck full ROM no tenderness Shoulders full ROM no tenderness or swelling Elbows full ROM no tenderness or swelling Wrists full ROM no tenderness or swelling Fingers full ROM no tenderness or swelling Knees full ROM no tenderness or swelling   Investigation: No additional findings.  Imaging: No results found.  Recent Labs: Lab Results  Component Value Date   WBC 7.9 04/06/2023   HGB 13.3 04/06/2023   PLT 227 04/06/2023   NA 140 04/06/2023   K 3.6 04/06/2023   CL 101 04/06/2023   CO2 33 (H) 04/06/2023   GLUCOSE 98 04/06/2023   BUN 14 04/06/2023   CREATININE 1.01 04/06/2023   BILITOT 0.5 04/18/2023   ALKPHOS 74 07/14/2022   AST 29 04/18/2023   ALT 32 (H) 04/18/2023   PROT 7.4 04/18/2023   ALBUMIN 3.7 (L) 07/14/2022   CALCIUM  9.0 04/06/2023   GFRAA 61 06/23/2020   QFTBGOLDPLUS NEGATIVE 01/29/2021    Speciality Comments: No specialty comments available.  Procedures:  No procedures performed Allergies: Patient has no known allergies.   Assessment / Plan:     Visit Diagnoses: MCTD (mixed connective tissue disease) (HCC)  ILD (interstitial lung disease) (HCC) - Plan: Sedimentation rate,  CK CT scan indicated increased scarring, suggesting uncontrolled inflammation. High CK levels confirmed active disease. Previous treatments were discontinued due to side effects. Does not have highly active disease systemically, so primary indication being lung activity. - Start Myfortic  (mycophenolic acid) twice daily if baseline labs okay - Monitor for gastrointestinal tolerance and effectiveness. - Consider injectable medication if Myfortic  is not tolerated.  High risk medication use - Plan: CBC with Differential/Platelet, Comprehensive metabolic panel with GFR Reviewed risks of medication myfortic  including GI intolerance, cytopenias, infection, malignancy with long ter use  Raynaud's phenomenon Episodes of finger discoloration due to cold exposure, related to connective tissue disease. No current tissue damage observed. - Monitor for signs of tissue damage, such as nail pits or blistering.    Orders: Orders Placed This Encounter  Procedures   Sedimentation rate   CK   CBC with Differential/Platelet   Comprehensive metabolic panel with GFR   No orders of the defined types were placed in this encounter.    Follow-Up Instructions: Return in about 3 months (around 01/16/2024) for MCTD MF start f/u 3mos.   Matt Song, MD  Note - This record has been created using [employer].  Chart creation errors have been sought, but may not always  have been located. Such creation errors do not reflect on  the standard of medical care.

## 2023-10-17 ENCOUNTER — Encounter: Payer: Self-pay | Admitting: Internal Medicine

## 2023-10-17 ENCOUNTER — Ambulatory Visit: Payer: BC Managed Care – PPO | Attending: Internal Medicine | Admitting: Internal Medicine

## 2023-10-17 VITALS — BP 130/83 | HR 103 | Resp 14 | Ht 65.5 in | Wt 226.0 lb

## 2023-10-17 DIAGNOSIS — Z79899 Other long term (current) drug therapy: Secondary | ICD-10-CM

## 2023-10-17 DIAGNOSIS — J849 Interstitial pulmonary disease, unspecified: Secondary | ICD-10-CM

## 2023-10-17 DIAGNOSIS — M351 Other overlap syndromes: Secondary | ICD-10-CM | POA: Diagnosis not present

## 2023-10-18 ENCOUNTER — Telehealth: Payer: Self-pay | Admitting: *Deleted

## 2023-10-18 LAB — CBC WITH DIFFERENTIAL/PLATELET
Absolute Lymphocytes: 2640 {cells}/uL (ref 850–3900)
Absolute Monocytes: 1471 {cells}/uL — ABNORMAL HIGH (ref 200–950)
Basophils Absolute: 77 {cells}/uL (ref 0–200)
Basophils Relative: 0.9 %
Eosinophils Absolute: 430 {cells}/uL (ref 15–500)
Eosinophils Relative: 5 %
HCT: 41.9 % (ref 35.0–45.0)
Hemoglobin: 13.2 g/dL (ref 11.7–15.5)
MCH: 28.6 pg (ref 27.0–33.0)
MCHC: 31.5 g/dL — ABNORMAL LOW (ref 32.0–36.0)
MCV: 90.7 fL (ref 80.0–100.0)
MPV: 11.2 fL (ref 7.5–12.5)
Monocytes Relative: 17.1 %
Neutro Abs: 3982 {cells}/uL (ref 1500–7800)
Neutrophils Relative %: 46.3 %
Platelets: 196 10*3/uL (ref 140–400)
RBC: 4.62 10*6/uL (ref 3.80–5.10)
RDW: 13.8 % (ref 11.0–15.0)
Total Lymphocyte: 30.7 %
WBC: 8.6 10*3/uL (ref 3.8–10.8)

## 2023-10-18 LAB — COMPREHENSIVE METABOLIC PANEL WITH GFR
AG Ratio: 0.9 (calc) — ABNORMAL LOW (ref 1.0–2.5)
ALT: 29 U/L (ref 6–29)
AST: 31 U/L (ref 10–35)
Albumin: 3.2 g/dL — ABNORMAL LOW (ref 3.6–5.1)
Alkaline phosphatase (APISO): 59 U/L (ref 37–153)
BUN: 11 mg/dL (ref 7–25)
CO2: 31 mmol/L (ref 20–32)
Calcium: 8.7 mg/dL (ref 8.6–10.4)
Chloride: 105 mmol/L (ref 98–110)
Creat: 0.95 mg/dL (ref 0.50–1.05)
Globulin: 3.6 g/dL (ref 1.9–3.7)
Glucose, Bld: 122 mg/dL — ABNORMAL HIGH (ref 65–99)
Potassium: 3.6 mmol/L (ref 3.5–5.3)
Sodium: 141 mmol/L (ref 135–146)
Total Bilirubin: 0.4 mg/dL (ref 0.2–1.2)
Total Protein: 6.8 g/dL (ref 6.1–8.1)
eGFR: 66 mL/min/{1.73_m2} (ref >=60)

## 2023-10-18 LAB — CK: Total CK: 196 U/L (ref 20–243)

## 2023-10-18 LAB — SEDIMENTATION RATE: Sed Rate: 17 mm/h (ref 0–30)

## 2023-10-18 NOTE — Telephone Encounter (Signed)
 The drugs we discussed were the myfortic (mycophenolic acid) which is a pill and methotrexate, which would be a self-administered shot at home weekly.

## 2023-10-18 NOTE — Telephone Encounter (Signed)
 Patient advised the drugs we discussed were the myfortic (mycophenolic acid) which is a pill and methotrexate, which would be a self-administered shot at home weekly.

## 2023-10-18 NOTE — Telephone Encounter (Signed)
 Patient contacted the office stating she has a question regarding her appointment yesterday. Patient states you discussed a medication with her for the treatment of her ILD. She would like to know if the medication you discussed is self administered or office administered.

## 2023-10-20 ENCOUNTER — Other Ambulatory Visit: Payer: Self-pay | Admitting: Family Medicine

## 2023-10-20 DIAGNOSIS — I10 Essential (primary) hypertension: Secondary | ICD-10-CM

## 2023-10-23 MED ORDER — MYCOPHENOLATE SODIUM 180 MG PO TBEC: 180.0000 mg | DELAYED_RELEASE_TABLET | Freq: Two times a day (BID) | ORAL | 2 refills | Status: DC

## 2023-10-29 ENCOUNTER — Other Ambulatory Visit: Payer: Self-pay | Admitting: Family Medicine

## 2023-10-29 ENCOUNTER — Other Ambulatory Visit: Payer: Self-pay | Admitting: Internal Medicine

## 2023-10-29 DIAGNOSIS — J849 Interstitial pulmonary disease, unspecified: Secondary | ICD-10-CM

## 2023-10-31 NOTE — Telephone Encounter (Signed)
 Is this okay to refill?

## 2023-12-06 ENCOUNTER — Telehealth: Payer: Self-pay

## 2023-12-06 NOTE — Telephone Encounter (Signed)
 Patient called the office to advise that she is stopping the use of the Mycophenolate  due to it causing her to throw up.

## 2023-12-06 NOTE — Telephone Encounter (Signed)
 Sorry to hear that, the intent was for the solution to be more tolerable. We can talk more about alternatives, possibly an injection to avoid the stomach problems, when we follow up.

## 2023-12-07 NOTE — Telephone Encounter (Signed)
 Attempted to contact the patient and left a message to call the office back.

## 2023-12-07 NOTE — Telephone Encounter (Signed)
 Patient called the office after receiving a voicemail from Addie. Advised the patient Sorry to hear that, the intent was for the solution to be more tolerable. We can talk more about alternatives, possibly an injection to avoid the stomach problems, when we follow up. Patient expressed understanding.

## 2023-12-27 NOTE — Progress Notes (Signed)
 Office Visit Note  Patient: Jocelyn Bond             Date of Birth: 08-20-1955           MRN: [id number]             PCP: Joyce Norleen BROCKS, MD Referring: Joyce Norleen BROCKS, MD Visit Date: 01/10/2024   Subjective:  Follow-up   Discussed the use of AI scribe software for clinical note transcription with the patient, who gave verbal consent to proceed.  History of Present Illness   Jocelyn Bond is a 68 y.o. 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  She does not have a lot of current symptom complaints regarding skin or joints or respiratory symptoms. She experiences significant gastrointestinal upset, including vomiting with trying to start the CellCept  and mycophenolic acid.  She has tried various medications, including azathioprine  and CellCept , but was unable to tolerate them.  She is currently experiencing swelling in her right leg or foot, which is a recurring issue. The swelling does not cause significant pain and tends to improve after sleeping or using compression.  No recent joint pain, infections, or new illnesses since her last visit. She is not a regular drinker, only occasionally consuming low-alcohol beverages like wine coolers.       Previous HPI 10/17/2023 Jocelyn Bond is a 68 y.o. 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     She is experiencing ongoing issues related to her connective tissue disease, primarily concerning her lung condition. A CT scan from February showed a mild increase in scarring compared to previous imaging. Blood tests have shown high CK levels.   She has had difficulty tolerating medications prescribed for her condition. She initially tried Imuran , which caused significant gastrointestinal distress, including vomiting. After discontinuing Imuran , her vomiting resolved. She also tried CellCept  (mycophenolate ), which similarly caused  gastrointestinal issues, leading to vomiting. Both medications were not well tolerated, and she is currently not on these treatments.   She experiences Raynaud's symptoms, such as her fingers turning white when exposed to cold, indicating circulation issues related to her connective tissue disease. No joint pain or stiffness. No skin issues on her hands or feet. No discoloration or other significant changes in her extremities.         Previous HPI 07/18/2023 Jocelyn Bond is a 68 y.o. 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   They report no new skin changes and deny any cough or shortness of breath, except for a brief coughing spell when rushing. They have not been sick with any upper respiratory infections recently. They have been trying to increase their physical activity by using a walking mat.   The patient's CK number, a muscle enzyme, has improved since the last visit. They were previously on mycophenolate , but it was discontinued due to adverse effects. The patient's neutrophils have been mildly low on previous CBC. TPMT level of 14 was normal.   Previous HPI 04/18/2023 Jocelyn Bond is a 68 y.o. 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  She started taking mycophenolate  500 mg twice daily initially without problem but for more than a week was experiencing persistent nausea vomiting and diarrhea.  She was not able to appreciate any difference in  swelling rashes nodules or any respiratory complaint.  Lab results from October 2 still showing elevated CK and ALT lab test.   Previous HPI 02/03/2023 Jocelyn Bond is a 68 y.o. 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  She has not this any particular changes with joint or muscle pain or  weakness.  Still has skin rashes most prominent with raised bumps on both arms not particularly painful or itchy.  Use of topical steroids was not significantly effective.  She had follow-up in pulmonology clinic with PFTs consistent with mild restrictive and diffusion defect somewhat worse compared to 2022 study.   Previous HPI 12/23/2022 Jocelyn Bond is a 68 y.o. 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  Since our last visit she has not had major change in symptoms.  Skin rash on her arms has changed slightly with some raised bumps as well as the discolored areas mostly around the elbows.  These are not itchy or painful.  She also has a painful area on the right lateral thigh this only hurts on some days.  Does not cause associated hip or knee pain and she does not see any visible swelling.  No radiation down to the foot.  Still sees intermittent discoloration and her fingers but less often with warm weather.  No particular cough or shortness of breath complaint.   Previous HPI 09/21/22 Jocelyn Bond is a 68 y.o. female here for follow up for systemic inflammatory symptoms and abnormal labs associated with interstitial lung disease.  It has been about 2 years since last follow-up she is doing well so did not pursue much additional workup or the recommended skin biopsy.  Repeat lung CT chest shows mild progression compared to 2022 imaging so was recommended probably needing to start treatment for her inflammatory lung disease.  She continues having mild amount of joint pain and stiffness in several areas but not seeing a lot of visible swelling and not taking any medications for this.  Has not been experiencing any worsening trouble with acid reflux or dysphagia.  She still having issues of sometimes violaceous sometimes erythematous discoloration in her distal fingers with cold exposure.  Not associated with any nail changes or skin lesions.  She had an episode of  rash across the back of her neck that resolved mostly on its own did use some over-the-counter topical cream.   Previous HPI 01/29/21 Jocelyn Bond is a 68 y.o. female here for evaluation for systemic connective tissue disease with positive antibodies and diagnosis of ILD.  She does seem to have a degree of chronic nonproductive cough but denies significant shortness of breath with exertion or at rest.  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  Current coughing has been noticeable since about 1 year ago.  She denies any particular complaints with joint pains or swelling.  She experiences some chronic rash on her distal arms and hands bilaterally that is controlled with topical treatment.  She experiences purple discoloration in the tips of the third finger of each hand with cold exposure that improved after rewarming.  She reports occasional choking on solid foods and having to regurgitate but this is infrequent.  She denies symptomatic acid reflux.  She experiences swelling of the right foot chronically states previous studies have indicated venous  insufficiency around the level of the ankle without a particularly identified cause.  She denies any history of blood clots.  She also denies alopecia, oral ulcers, or photosensitive skin rashes.   Labs reviewed 11/2020 ANA 1:1280 cytoplasmic/reticular 1:1280 speckled Scl-70, SSA, SSb neg RF 40 CCP neg MyoMarker 3 Pl-12 weak pos, Ku weak pos, SSA 52kD 47, U1RNP 152, U2RNP moderate pos ANCA neg CK 199     Review of Systems  Constitutional:  Negative for fatigue.  HENT:  Positive for mouth dryness. Negative for mouth sores.   Eyes:  Negative for dryness.  Respiratory:  Negative for shortness of breath.   Cardiovascular:  Negative for chest pain and palpitations.  Gastrointestinal:  Negative  for blood in stool, constipation and diarrhea.  Endocrine: Negative for increased urination.  Genitourinary:  Negative for involuntary urination.  Musculoskeletal:  Positive for joint swelling. Negative for joint pain, gait problem, joint pain, myalgias, muscle weakness, morning stiffness, muscle tenderness and myalgias.  Skin:  Positive for hair loss and sensitivity to sunlight. Negative for color change and rash.  Allergic/Immunologic: Negative for susceptible to infections.  Neurological:  Negative for dizziness and headaches.  Hematological:  Negative for swollen glands.  Psychiatric/Behavioral:  Negative for depressed mood and sleep disturbance. The patient is not nervous/anxious.     PMFS History:  Patient Active Problem List   Diagnosis Date Noted   MCTD (mixed connective tissue disease) (HCC) 12/23/2022   High frequency hearing loss 06/30/2021   Aortic atherosclerosis (HCC) 06/30/2021   Positive ANA (antinuclear antibody) 01/29/2021   ILD (interstitial lung disease) (HCC) 01/29/2021   High risk medication use 01/29/2021   Seborrheic keratosis 06/20/2019   Lymphadenopathy of head and neck 02/16/2016   Chronic venous insufficiency 12/11/2012   Obesity (BMI 30-39.9) 12/11/2012   Essential hypertension 01/09/2008    Past Medical History:  Diagnosis Date   Allergy    very rare    Hypertension    ILD (interstitial lung disease) (HCC)    Obesity     Family History  Problem Relation Age of Onset   Hypertension Mother    Hypertension Father    Hypertension Sister    Cancer Sister        breast ca   Hypertension Sister    Arthritis/Rheumatoid Sister    Hypertension Sister    Fibromyalgia Sister    Hypertension Sister    Healthy Son    Healthy Daughter    Healthy Daughter    Colon cancer Neg Hx    Colon polyps Neg Hx    Esophageal cancer Neg Hx    Rectal cancer Neg Hx    Stomach cancer Neg Hx    Past Surgical History:  Procedure Laterality Date   BTL      BUNIONECTOMY     COLONOSCOPY  2009   13 yrs ago Margarete- pt states removed polyps , no report, no path    LUNG BIOPSY  2009   TUBAL LIGATION     Social History   Social History Narrative   Not on file   Immunization History  Administered Date(s) Administered   Fluad Quad(high Dose 65+) 02/19/2022   Influenza,inj,Quad PF,6+ Mos 04/10/2018, 06/12/2020, 04/13/2021   Influenza-Unspecified 05/05/2016   Moderna Covid-19 Fall Seasonal Vaccine 74yrs & older 04/24/2023   Moderna Covid-19 Vaccine Bivalent Booster 33yrs & up 02/13/2021   Moderna Sars-Covid-2 Vaccination 09/13/2019, 10/16/2019, 06/09/2020   PNEUMOCOCCAL CONJUGATE-20 06/30/2021   Pfizer Covid-19 Vaccine Bivalent Booster 24yrs & up 02/19/2022  Respiratory Syncytial Virus Vaccine,Recomb Aduvanted(Arexvy) 07/16/2022   Tdap 08/06/2010, 04/13/2021   Zoster Recombinant(Shingrix ) 04/10/2018, 07/11/2018     Objective: Vital Signs: BP 136/87 (BP Location: Left Arm, Patient Position: Sitting, Cuff Size: Normal)   Pulse 81   Resp 14   Ht 5' 5.5 (1.664 m)   Wt 228 lb (103.4 kg)   BMI 37.36 kg/m    Physical Exam Eyes:     Conjunctiva/sclera: Conjunctivae normal.  Cardiovascular:     Rate and Rhythm: Normal rate and regular rhythm.  Pulmonary:     Effort: Pulmonary effort is normal.     Comments: Bibasilar inspiratory crackles Skin:    General: Skin is warm and dry.     Comments: Right foot and ankle 1+ pitting edema no overlying skin rash or discoloration  Neurological:     Mental Status: She is alert.  Psychiatric:        Mood and Affect: Mood normal.      Musculoskeletal Exam:  Shoulders full ROM no tenderness or swelling Elbows full ROM no tenderness or swelling Wrists full ROM no tenderness or swelling Fingers full ROM no tenderness or swelling Knees full ROM no tenderness or swelling Ankles full ROM no tenderness or swelling    Investigation: No additional findings.  Imaging: No results found.  Recent  Labs: Lab Results  Component Value Date   WBC 8.6 10/17/2023   HGB 13.2 10/17/2023   PLT 196 10/17/2023   NA 141 10/17/2023   K 3.6 10/17/2023   CL 105 10/17/2023   CO2 31 10/17/2023   GLUCOSE 122 (H) 10/17/2023   BUN 11 10/17/2023   CREATININE 0.95 10/17/2023   BILITOT 0.4 10/17/2023   ALKPHOS 74 07/14/2022   AST 31 10/17/2023   ALT 29 10/17/2023   PROT 6.8 10/17/2023   ALBUMIN 3.7 (L) 07/14/2022   CALCIUM  8.7 10/17/2023   GFRAA 61 06/23/2020   QFTBGOLDPLUS NEGATIVE 01/29/2021    Speciality Comments: No specialty comments available.  Procedures:  No procedures performed Allergies: Patient has no known allergies.   Assessment / Plan:     Visit Diagnoses: MCTD (mixed connective tissue disease) (HCC) - Plan: TUBERCULIN SYR 1CC/26GX3/8 26G X 3/8 1 ML MISC, methotrexate  50 MG/2ML injection, folic acid  (FOLVITE ) 1 MG tablet, CK Inflammatory lung disease Inflammatory lung disease managed with methotrexate  due to gastrointestinal intolerance of previous medications. Methotrexate  preferred for its tolerability and low-dose use, minimizing severe side effects.  Could be less favorable in setting of lung fibrosis but with evidence of ongoing active inflammatory process. - Start methotrexate  15 mg subcu weekly folic acid  1 mg daily  High risk medication use - Plan: CBC with Differential/Platelet, Comprehensive metabolic panel with GFR Reviewed risk of methotrexate  including injection reactions, cytopenias, liver toxicity, infections, effects on hair or nail thinning. - Future orders for blood count and metabolic panel for medication monitoring 2 to 4 weeks after methotrexate  start    Orders: Orders Placed This Encounter  Procedures   CK   CBC with Differential/Platelet   Comprehensive metabolic panel with GFR   Meds ordered this encounter  Medications   TUBERCULIN SYR 1CC/26GX3/8 26G X 3/8 1 ML MISC    Sig: For once weekly injection of methotrexate     Dispense:  25 each     Refill:  0    Substitution with alternative 1mL volume syringe is acceptable   methotrexate  50 MG/2ML injection    Sig: Inject 0.6 mLs (15 mg total) into the skin once a  week.    Dispense:  4 mL    Refill:  1   folic acid  (FOLVITE ) 1 MG tablet    Sig: Take 1 tablet (1 mg total) by mouth daily.    Dispense:  90 tablet    Refill:  0     Follow-Up Instructions: Return in about 3 months (around 04/11/2024) for MCTD DM/ILD MTX start f/u 3mos.   Lonni LELON Ester, MD  Note - This record has been created using [employer].  Chart creation errors have been sought, but may not always  have been located. Such creation errors do not reflect on  the standard of medical care.

## 2024-01-05 ENCOUNTER — Other Ambulatory Visit: Payer: Self-pay | Admitting: Family Medicine

## 2024-01-05 DIAGNOSIS — J849 Interstitial pulmonary disease, unspecified: Secondary | ICD-10-CM

## 2024-01-10 ENCOUNTER — Encounter: Payer: Self-pay | Admitting: Internal Medicine

## 2024-01-10 ENCOUNTER — Ambulatory Visit: Attending: Internal Medicine | Admitting: Internal Medicine

## 2024-01-10 VITALS — BP 136/87 | HR 81 | Resp 14 | Ht 65.5 in | Wt 228.0 lb

## 2024-01-10 DIAGNOSIS — M351 Other overlap syndromes: Secondary | ICD-10-CM

## 2024-01-10 DIAGNOSIS — Z79899 Other long term (current) drug therapy: Secondary | ICD-10-CM

## 2024-01-10 DIAGNOSIS — J849 Interstitial pulmonary disease, unspecified: Secondary | ICD-10-CM

## 2024-01-10 MED ORDER — METHOTREXATE SODIUM CHEMO INJECTION 50 MG/2ML: 15.0000 mg | INTRAMUSCULAR | 1 refills | Status: DC

## 2024-01-10 MED ORDER — TUBERCULIN SYRINGE 26G X 3/8" 1 ML MISC: 0 refills | Status: DC

## 2024-01-10 MED ORDER — FOLIC ACID 1 MG PO TABS: 1.0000 mg | ORAL_TABLET | Freq: Every day | ORAL | 0 refills | Status: DC

## 2024-01-18 ENCOUNTER — Ambulatory Visit: Admitting: Internal Medicine

## 2024-03-20 ENCOUNTER — Telehealth: Payer: Self-pay | Admitting: *Deleted

## 2024-03-20 NOTE — Telephone Encounter (Signed)
 Patient states she was prescribed Methotrexate  injections. Patient states she has decided she does not want to proceed with the injections. Patient states she is uncomfortable with injecting herself. Patient states she also is concerned about the side effects. Should patient schedule an appointment to discuss treatment options?

## 2024-03-22 NOTE — Telephone Encounter (Signed)
 Yes I think we'd need an appointment to discuss. Her appointment for October 6th for this should be fine.

## 2024-03-22 NOTE — Telephone Encounter (Signed)
 Attempted to contact the patient and left message for patient to advise yes Dr. Jeannetta thinks we'd need an appointment to discuss. Her appointment for October 6th for this should be fine.

## 2024-03-27 NOTE — Progress Notes (Signed)
 Office Visit Note  Patient: Jocelyn Bond             Date of Birth: 10-Feb-1956           MRN: [id number]             PCP: Joyce Norleen BROCKS, MD Referring: Joyce Norleen BROCKS, MD Visit Date: 04/09/2024   Subjective:  Medical Management of Chronic Issues (No longer taking methotrexate  injections ) and Joint Swelling (Bilateral feet )    Discussed the use of AI scribe software for clinical note transcription with the patient, who gave verbal consent to proceed.  History of Present Illness   Jocelyn Bond is a 68 y.o. 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 Currently monitoring after stopping methotrexate  for concern about self injecting medication and side effects risk.  Over the past four weeks, she has experienced increased swelling in her feet and hands, particularly noticeable in her middle knuckles and fingers, which appear puffy but are not painful. The swelling in her feet decreases at night but returns during the day.  She has a history of interstitial lung disease and has previously tried medications such as CellCept  and Imuran , which caused gastrointestinal side effects like nausea and vomiting. Methotrexate  was considered but she was uncomfortable with self-administering injections. She is currently not on these medications due to intolerance.  She reports frequent urination. No recent changes in weight or shortness of breath. She has been informed in the past that her leg veins are not functioning well, contributing to her chronic swelling, which has been present for years.  She mentions that she has been doing some walking and can elevate her feet on her sofa with pillows.      Previous HPI 01/10/2024 Jocelyn Bond is a 68 y.o. 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   She does not have a lot of current symptom complaints regarding skin or joints or respiratory  symptoms. She experiences significant gastrointestinal upset, including vomiting with trying to start the CellCept  and mycophenolic acid.  She has tried various medications, including azathioprine  and CellCept , but was unable to tolerate them.   She is currently experiencing swelling in her right leg or foot, which is a recurring issue. The swelling does not cause significant pain and tends to improve after sleeping or using compression.   No recent joint pain, infections, or new illnesses since her last visit. She is not a regular drinker, only occasionally consuming low-alcohol beverages like wine coolers.         Previous HPI 10/17/2023 Jocelyn Bond is a 68 y.o. 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     She is experiencing ongoing issues related to her connective tissue disease, primarily concerning her lung condition. A CT scan from February showed a mild increase in scarring compared to previous imaging. Blood tests have shown high CK levels.   She has had difficulty tolerating medications prescribed for her condition. She initially tried Imuran , which caused significant gastrointestinal distress, including vomiting. After discontinuing Imuran , her vomiting resolved. She also tried CellCept  (mycophenolate ), which similarly caused gastrointestinal issues, leading to vomiting. Both medications were not well tolerated, and she is currently not on these treatments.   She experiences Raynaud's symptoms, such as her fingers turning white when exposed to cold, indicating circulation issues related to  her connective tissue disease. No joint pain or stiffness. No skin issues on her hands or feet. No discoloration or other significant changes in her extremities.         Previous HPI 07/18/2023 Jocelyn Bond is a 68 y.o. 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   They report no new skin  changes and deny any cough or shortness of breath, except for a brief coughing spell when rushing. They have not been sick with any upper respiratory infections recently. They have been trying to increase their physical activity by using a walking mat.   The patient's CK number, a muscle enzyme, has improved since the last visit. They were previously on mycophenolate , but it was discontinued due to adverse effects. The patient's neutrophils have been mildly low on previous CBC. TPMT level of 14 was normal.   Previous HPI 04/18/2023 Jocelyn Bond is a 68 y.o. 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  She started taking mycophenolate  500 mg twice daily initially without problem but for more than a week was experiencing persistent nausea vomiting and diarrhea.  She was not able to appreciate any difference in swelling rashes nodules or any respiratory complaint.  Lab results from October 2 still showing elevated CK and ALT lab test.   Previous HPI 02/03/2023 Jocelyn Bond is a 68 y.o. 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  She has not this any particular changes with joint or muscle pain or weakness.  Still has skin rashes most prominent with raised bumps on both arms not particularly painful or itchy.  Use of topical steroids was not significantly effective.  She had follow-up in pulmonology clinic with PFTs consistent with mild restrictive and diffusion defect somewhat worse compared to 2022 study.   Previous HPI 12/23/2022 Jocelyn Bond is a 68 y.o. 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  Since our last visit she has not had major change in symptoms.  Skin rash on her arms has changed slightly with some raised bumps as well as the discolored areas mostly around  the elbows.  These are not itchy or painful.  She also has a painful area on the right lateral thigh this only hurts on some days.  Does not cause associated hip or knee pain and she does not see any visible swelling.  No radiation down to the foot.  Still sees intermittent discoloration and her fingers but less often with warm weather.  No particular cough or shortness of breath complaint.   Previous HPI 09/21/22 Jocelyn Bond is a 68 y.o. female here for follow up for systemic inflammatory symptoms and abnormal labs associated with interstitial lung disease.  It has been about 2 years since last follow-up she is doing well so did not pursue much additional workup or the recommended skin biopsy.  Repeat lung CT chest shows mild progression compared to 2022 imaging so was recommended probably needing to start treatment for her inflammatory lung disease.  She continues having mild amount of joint pain and stiffness in several areas but not seeing a lot of visible swelling and not taking any medications for this.  Has not been experiencing any worsening trouble with acid reflux or dysphagia.  She still having issues of sometimes violaceous sometimes erythematous  discoloration in her distal fingers with cold exposure.  Not associated with any nail changes or skin lesions.  She had an episode of rash across the back of her neck that resolved mostly on its own did use some over-the-counter topical cream.   Previous HPI 01/29/21 Jocelyn Bond is a 68 y.o. female here for evaluation for systemic connective tissue disease with positive antibodies and diagnosis of ILD.  She does seem to have a degree of chronic nonproductive cough but denies significant shortness of breath with exertion or at rest.  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  Current coughing  has been noticeable since about 1 year ago.  She denies any particular complaints with joint pains or swelling.  She experiences some chronic rash on her distal arms and hands bilaterally that is controlled with topical treatment.  She experiences purple discoloration in the tips of the third finger of each hand with cold exposure that improved after rewarming.  She reports occasional choking on solid foods and having to regurgitate but this is infrequent.  She denies symptomatic acid reflux.  She experiences swelling of the right foot chronically states previous studies have indicated venous insufficiency around the level of the ankle without a particularly identified cause.  She denies any history of blood clots.  She also denies alopecia, oral ulcers, or photosensitive skin rashes.   Labs reviewed 11/2020 ANA 1:1280 cytoplasmic/reticular 1:1280 speckled Scl-70, SSA, SSb neg RF 40 CCP neg MyoMarker 3 Pl-12 weak pos, Ku weak pos, SSA 52kD 47, U1RNP 152, U2RNP moderate pos ANCA neg CK 199       Review of Systems  Constitutional:  Negative for fatigue.  HENT:  Positive for mouth dryness. Negative for mouth sores.   Eyes:  Negative for dryness.  Respiratory:  Negative for shortness of breath.   Cardiovascular:  Negative for chest pain and palpitations.  Gastrointestinal:  Negative for blood in stool, constipation and diarrhea.  Endocrine: Negative for increased urination.  Genitourinary:  Negative for involuntary urination.  Musculoskeletal:  Positive for joint swelling. Negative for joint pain, gait problem, joint pain, myalgias, muscle weakness, morning stiffness, muscle tenderness and myalgias.  Skin:  Positive for color change and sensitivity to sunlight. Negative for rash and hair loss.  Allergic/Immunologic: Negative for susceptible to infections.  Neurological:  Negative for dizziness and headaches.  Hematological:  Negative for swollen glands.  Psychiatric/Behavioral:  Negative  for depressed mood and sleep disturbance. The patient is not nervous/anxious.     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   High frequency hearing loss 06/30/2021   Aortic atherosclerosis 06/30/2021   Positive ANA (antinuclear antibody) 01/29/2021   ILD (interstitial lung disease) (HCC) 01/29/2021   High risk medication use 01/29/2021   Seborrheic keratosis 06/20/2019   Lymphadenopathy of head and neck 02/16/2016   Chronic venous insufficiency 12/11/2012   Obesity (BMI 30-39.9) 12/11/2012   Essential hypertension 01/09/2008    Past Medical History:  Diagnosis Date   Allergy    very rare    Hypertension    ILD (interstitial lung disease) (HCC)    Obesity     Family History  Problem Relation Age of Onset   Hypertension Mother    Hypertension Father    Hypertension Sister    Cancer  Sister        breast ca   Hypertension Sister    Arthritis/Rheumatoid Sister    Hypertension Sister    Fibromyalgia Sister    Hypertension Sister    Healthy Son    Healthy Daughter    Healthy Daughter    Colon cancer Neg Hx    Colon polyps Neg Hx    Esophageal cancer Neg Hx    Rectal cancer Neg Hx    Stomach cancer Neg Hx    Past Surgical History:  Procedure Laterality Date   BTL     BUNIONECTOMY     COLONOSCOPY  2009   13 yrs ago Margarete- pt states removed polyps , no report, no path    LUNG BIOPSY  2009   TUBAL LIGATION     Social History   Social History Narrative   Not on file   Immunization History  Administered Date(s) Administered   Fluad Quad(high Dose 65+) 02/19/2022   Influenza,inj,Quad PF,6+ Mos 04/10/2018, 06/12/2020, 04/13/2021   Influenza-Unspecified 05/05/2016   Moderna Covid-19 Fall Seasonal Vaccine 50yrs & older 04/24/2023   Moderna Covid-19 Vaccine Bivalent Booster 28yrs & up 02/13/2021   Moderna Sars-Covid-2 Vaccination 09/13/2019, 10/16/2019, 06/09/2020    PNEUMOCOCCAL CONJUGATE-20 06/30/2021   Pfizer Covid-19 Vaccine Bivalent Booster 63yrs & up 02/19/2022   Respiratory Syncytial Virus Vaccine,Recomb Aduvanted(Arexvy) 07/16/2022   Tdap 08/06/2010, 04/13/2021   Zoster Recombinant(Shingrix ) 04/10/2018, 07/11/2018     Objective: Vital Signs: BP 123/73   Pulse 81   Temp (!) 97.5 F (36.4 C)   Resp 16   Ht 5' 5.5 (1.664 m)   Wt 228 lb 9.6 oz (103.7 kg)   BMI 37.46 kg/m    Physical Exam Eyes:     Conjunctiva/sclera: Conjunctivae normal.  Cardiovascular:     Rate and Rhythm: Normal rate and regular rhythm.  Pulmonary:     Effort: Pulmonary effort is normal.     Breath sounds: Normal breath sounds.     Comments: And inspiratory crackles left greater than right lung bases Lymphadenopathy:     Cervical: No cervical adenopathy.  Skin:    General: Skin is warm and dry.     Comments: Right foot and ankle 2+ pitting edema left 1+  Neurological:     Mental Status: She is alert.  Psychiatric:        Mood and Affect: Mood normal.      Musculoskeletal Exam:  Shoulders full ROM no tenderness or swelling Elbows full ROM no tenderness or swelling Wrists full ROM no tenderness or swelling Fingers full ROM no tenderness, PIP joints slight swelling and hyperpigmentation present Knees full ROM no tenderness or swelling Ankles full ROM no tenderness or swelling    Investigation: No additional findings.  Imaging: No results found.  Recent Labs: Lab Results  Component Value Date   WBC 8.6 10/17/2023   HGB 13.2 10/17/2023   PLT 196 10/17/2023   NA 139 04/09/2024   K 3.8 04/09/2024   CL 102 04/09/2024   CO2 32 04/09/2024   GLUCOSE 124 (H) 04/09/2024   BUN 11 04/09/2024   CREATININE 1.06 (H) 04/09/2024   BILITOT 0.5 04/09/2024   ALKPHOS 74 07/14/2022   AST 28 04/09/2024   ALT 21 04/09/2024   PROT 7.2 04/09/2024   ALBUMIN 3.7 (L) 07/14/2022   CALCIUM  9.0 04/09/2024   GFRAA 61 06/23/2020   QFTBGOLDPLUS NEGATIVE 01/29/2021     Speciality Comments: No specialty comments available.  Procedures:  No procedures performed  Allergies: Patient has no known allergies.   Assessment / Plan:     Visit Diagnoses: MCTD (mixed connective tissue disease) - Plan: CK, B Nat Peptide, Comprehensive metabolic panel with GFR Previous treatments with CellCept , Imuran , and methotrexate  were not tolerated due to gastrointestinal side effects. Current inflammation not severe for extrapulmonary disease activity.  We can recheck her labs previously elevated CK and or sed rate have corresponded with some radiographic progression on lungs.  If persistently elevated could consider maintaining on low-dose prednisone  for now versus option of rituximab infusion - Rechecking CK for disease activity monitoring - Consider low-dose prednisone  (5 mg or less) for long-term management. - Consider rituximab infusion every six months if disease progression/no lab response to oral treatment.  Pedal edema Chronic venous insufficiency - Plan: B Nat Peptide Edema worsens during the day, improves with leg elevation. No change in shortness of breath or weight. Losartan  used, not Lasix or furosemide. Potential causes include salt intake, kidney, or heart issues. - Order blood tests including ProBNP to assess heart and kidney function. - Advise wearing compression socks during the day. - Advise elevating feet when sitting for extended periods. - Consider additional diuretic if ProBNP is elevated.        Orders: Orders Placed This Encounter  Procedures   CK   B Nat Peptide   Comprehensive metabolic panel with GFR   No orders of the defined types were placed in this encounter.    Follow-Up Instructions: Return in about 3 months (around 07/10/2024) for MCTD ?GC resume f/u 3mos.   Lonni LELON Ester, MD  Note - This record has been created using [employer].  Chart creation errors have been sought, but may not always  have been located. Such  creation errors do not reflect on  the standard of medical care.

## 2024-04-05 ENCOUNTER — Other Ambulatory Visit: Payer: Self-pay | Admitting: Family Medicine

## 2024-04-05 DIAGNOSIS — J849 Interstitial pulmonary disease, unspecified: Secondary | ICD-10-CM

## 2024-04-06 NOTE — Telephone Encounter (Signed)
 MWV/AWV Scheduled for 05/14/24

## 2024-04-08 ENCOUNTER — Other Ambulatory Visit: Payer: Self-pay | Admitting: Internal Medicine

## 2024-04-08 DIAGNOSIS — M351 Other overlap syndromes: Secondary | ICD-10-CM

## 2024-04-09 ENCOUNTER — Ambulatory Visit: Attending: Internal Medicine | Admitting: Internal Medicine

## 2024-04-09 ENCOUNTER — Encounter: Payer: Self-pay | Admitting: Internal Medicine

## 2024-04-09 VITALS — BP 123/73 | HR 81 | Temp 97.5°F | Resp 16 | Ht 65.5 in | Wt 228.6 lb

## 2024-04-09 DIAGNOSIS — Z79899 Other long term (current) drug therapy: Secondary | ICD-10-CM

## 2024-04-09 DIAGNOSIS — M351 Other overlap syndromes: Secondary | ICD-10-CM | POA: Diagnosis not present

## 2024-04-09 DIAGNOSIS — I872 Venous insufficiency (chronic) (peripheral): Secondary | ICD-10-CM | POA: Diagnosis not present

## 2024-04-09 DIAGNOSIS — J849 Interstitial pulmonary disease, unspecified: Secondary | ICD-10-CM

## 2024-04-09 DIAGNOSIS — R600 Localized edema: Secondary | ICD-10-CM | POA: Diagnosis not present

## 2024-04-09 NOTE — Telephone Encounter (Signed)
 Last Fill: 01/10/2024  Next Visit: 07/16/2024  Last Visit: 04/09/2024  Dx: MCTD   Current Dose per office note on 01/10/2024 folic acid  1 mg daily   Okay to refill Folic Acid ?

## 2024-04-10 LAB — COMPREHENSIVE METABOLIC PANEL WITH GFR
AG Ratio: 0.8 (calc) — ABNORMAL LOW (ref 1.0–2.5)
ALT: 21 U/L (ref 6–29)
AST: 28 U/L (ref 10–35)
Albumin: 3.3 g/dL — ABNORMAL LOW (ref 3.6–5.1)
Alkaline phosphatase (APISO): 60 U/L (ref 37–153)
BUN/Creatinine Ratio: 10 (calc) (ref 6–22)
BUN: 11 mg/dL (ref 7–25)
CO2: 32 mmol/L (ref 20–32)
Calcium: 9 mg/dL (ref 8.6–10.4)
Chloride: 102 mmol/L (ref 98–110)
Creat: 1.06 mg/dL — ABNORMAL HIGH (ref 0.50–1.05)
Globulin: 3.9 g/dL — ABNORMAL HIGH (ref 1.9–3.7)
Glucose, Bld: 124 mg/dL — ABNORMAL HIGH (ref 65–99)
Potassium: 3.8 mmol/L (ref 3.5–5.3)
Sodium: 139 mmol/L (ref 135–146)
Total Bilirubin: 0.5 mg/dL (ref 0.2–1.2)
Total Protein: 7.2 g/dL (ref 6.1–8.1)
eGFR: 58 mL/min/1.73m2 — ABNORMAL LOW (ref >=60)

## 2024-04-10 LAB — BRAIN NATRIURETIC PEPTIDE: Brain Natriuretic Peptide: 41 pg/mL (ref <100)

## 2024-04-10 LAB — CK: Total CK: 248 U/L — ABNORMAL HIGH (ref 20–243)

## 2024-04-22 ENCOUNTER — Other Ambulatory Visit: Payer: Self-pay | Admitting: Family Medicine

## 2024-04-22 DIAGNOSIS — I10 Essential (primary) hypertension: Secondary | ICD-10-CM

## 2024-05-14 ENCOUNTER — Ambulatory Visit: Payer: Self-pay | Admitting: Family Medicine

## 2024-05-14 ENCOUNTER — Encounter: Payer: Self-pay | Admitting: Family Medicine

## 2024-05-14 VITALS — BP 130/82 | HR 84 | Ht 65.0 in | Wt 227.4 lb

## 2024-05-14 DIAGNOSIS — Z23 Encounter for immunization: Secondary | ICD-10-CM | POA: Diagnosis not present

## 2024-05-14 DIAGNOSIS — Z780 Asymptomatic menopausal state: Secondary | ICD-10-CM

## 2024-05-14 DIAGNOSIS — R600 Localized edema: Secondary | ICD-10-CM | POA: Diagnosis not present

## 2024-05-14 DIAGNOSIS — I10 Essential (primary) hypertension: Secondary | ICD-10-CM

## 2024-05-14 DIAGNOSIS — E669 Obesity, unspecified: Secondary | ICD-10-CM | POA: Diagnosis not present

## 2024-05-14 DIAGNOSIS — E785 Hyperlipidemia, unspecified: Secondary | ICD-10-CM

## 2024-05-14 DIAGNOSIS — Z0000 Encounter for general adult medical examination without abnormal findings: Secondary | ICD-10-CM

## 2024-05-14 DIAGNOSIS — Z1231 Encounter for screening mammogram for malignant neoplasm of breast: Secondary | ICD-10-CM

## 2024-05-14 DIAGNOSIS — K219 Gastro-esophageal reflux disease without esophagitis: Secondary | ICD-10-CM

## 2024-05-14 LAB — COMPREHENSIVE METABOLIC PANEL WITH GFR
ALT: 20 IU/L (ref 0–32)
AST: 30 IU/L (ref 0–40)
Albumin: 3.1 g/dL — ABNORMAL LOW (ref 3.9–4.9)
Alkaline Phosphatase: 67 IU/L (ref 49–135)
BUN/Creatinine Ratio: 9 — ABNORMAL LOW (ref 12–28)
BUN: 9 mg/dL (ref 8–27)
Bilirubin Total: 0.4 mg/dL (ref 0.0–1.2)
CO2: 26 mmol/L (ref 20–29)
Calcium: 8.9 mg/dL (ref 8.7–10.3)
Chloride: 102 mmol/L (ref 96–106)
Creatinine, Ser: 0.96 mg/dL (ref 0.57–1.00)
Globulin, Total: 3.8 g/dL (ref 1.5–4.5)
Glucose: 107 mg/dL — ABNORMAL HIGH (ref 70–99)
Potassium: 3.4 mmol/L — ABNORMAL LOW (ref 3.5–5.2)
Sodium: 139 mmol/L (ref 134–144)
Total Protein: 6.9 g/dL (ref 6.0–8.5)
eGFR: 65 mL/min/1.73 (ref >59)

## 2024-05-14 LAB — CBC WITH DIFFERENTIAL/PLATELET
Basophils Absolute: 0.1 x10E3/uL (ref 0.0–0.2)
Basos: 1 %
EOS (ABSOLUTE): 0.4 x10E3/uL (ref 0.0–0.4)
Eos: 4 %
Hematocrit: 39.8 % (ref 34.0–46.6)
Hemoglobin: 12.7 g/dL (ref 11.1–15.9)
Immature Grans (Abs): 0 x10E3/uL (ref 0.0–0.1)
Immature Granulocytes: 0 %
Lymphocytes Absolute: 2.2 x10E3/uL (ref 0.7–3.1)
Lymphs: 23 %
MCH: 28.2 pg (ref 26.6–33.0)
MCHC: 31.9 g/dL (ref 31.5–35.7)
MCV: 88 fL (ref 79–97)
Monocytes Absolute: 1.6 x10E3/uL — ABNORMAL HIGH (ref 0.1–0.9)
Monocytes: 16 %
Neutrophils Absolute: 5.3 x10E3/uL (ref 1.4–7.0)
Neutrophils: 56 %
Platelets: 233 x10E3/uL (ref 150–450)
RBC: 4.51 x10E6/uL (ref 3.77–5.28)
RDW: 14.5 % (ref 11.7–15.4)
WBC: 9.6 x10E3/uL (ref 3.4–10.8)

## 2024-05-14 LAB — LIPID PANEL
Chol/HDL Ratio: 2.5 ratio (ref 0.0–4.4)
Cholesterol, Total: 133 mg/dL (ref 100–199)
HDL: 54 mg/dL (ref >39)
LDL Chol Calc (NIH): 64 mg/dL (ref 0–99)
Triglycerides: 75 mg/dL (ref 0–149)
VLDL Cholesterol Cal: 15 mg/dL (ref 5–40)

## 2024-05-14 LAB — POCT GLYCOSYLATED HEMOGLOBIN (HGB A1C): Hemoglobin A1C: 5.7 % — AB (ref 4.0–5.6)

## 2024-05-14 MED ORDER — LOSARTAN POTASSIUM-HCTZ 100-25 MG PO TABS
1.0000 | ORAL_TABLET | Freq: Every day | ORAL | 1 refills | Status: AC
Start: 2024-05-14 — End: ?

## 2024-05-14 MED ORDER — FAMOTIDINE 20 MG PO TABS: 20.0000 mg | ORAL_TABLET | Freq: Two times a day (BID) | ORAL | 0 refills | Status: DC

## 2024-05-14 NOTE — Progress Notes (Signed)
 Subjective:   Jocelyn Bond is a 68 y.o. female who presents for a Medicare Annual Wellness Visit.  Allergies (verified) Patient has no known allergies.   History: Past Medical History:  Diagnosis Date   Allergy    very rare    Hypertension    ILD (interstitial lung disease) (HCC)    Obesity    Past Surgical History:  Procedure Laterality Date   BTL     BUNIONECTOMY     COLONOSCOPY  2009   13 yrs ago [facility]- pt states removed polyps , no report, no path    LUNG BIOPSY  2009   TUBAL LIGATION     Family History  Problem Relation Age of Onset   Hypertension Mother    Hypertension Father    Hypertension Sister    Cancer Sister        breast ca   Hypertension Sister    Arthritis/Rheumatoid Sister    Hypertension Sister    Fibromyalgia Sister    Hypertension Sister    Healthy Son    Healthy Daughter    Healthy Daughter    Colon cancer Neg Hx    Colon polyps Neg Hx    Esophageal cancer Neg Hx    Rectal cancer Neg Hx    Stomach cancer Neg Hx    Social History   Occupational History   Not on file  Tobacco Use   Smoking status: Never    Passive exposure: Past   Smokeless tobacco: Never  Vaping Use   Vaping status: Never Used  Substance and Sexual Activity   Alcohol use: Yes    Alcohol/week: 1.0 standard drink of alcohol    Types: 1 Glasses of wine per week    Comment: once a year   Drug use: No   Sexual activity: Not Currently   Tobacco Counseling Counseling given: Not Answered  SDOH Screenings   Food Insecurity: No Food Insecurity (05/14/2024)  Housing: Low Risk  (05/14/2024)  Transportation Needs: No Transportation Needs (05/14/2024)  Utilities: Not At Risk (05/14/2024)  Alcohol Screen: Low Risk  (05/10/2024)  Depression (PHQ2-9): Low Risk  (05/14/2024)  Financial Resource Strain: Low Risk  (05/10/2024)  Physical Activity: Sufficiently Active (05/14/2024)  Social Connections: Moderately Isolated (05/14/2024)  Stress: No Stress Concern Present  (05/14/2024)  Tobacco Use: Low Risk  (05/14/2024)  Health Literacy: Adequate Health Literacy (05/14/2024)   Depression Screen    05/14/2024    9:16 AM 07/14/2022    3:10 PM 06/30/2021   11:11 AM 06/23/2020    1:48 PM 06/20/2019    1:41 PM 04/10/2018   11:08 AM 12/19/2015    8:46 AM  PHQ 2/9 Scores  PHQ - 2 Score 0 0 0 0 0 0 0     Goals Addressed             This Visit's Progress    Increase physical activity         Visit info / Clinical Intake: Medicare Wellness Visit Type:: Subsequent Annual Wellness Visit Medicare Wellness Visit Mode:: In-person (required for WTM) Interpreter Needed?: No Pre-visit prep was completed: no AWV questionnaire completed by patient prior to visit?: no Living arrangements:: (!) lives alone Patient's Overall Health Status Rating: good Typical amount of pain: none Does pain affect daily life?: no Are you currently prescribed opioids?: no  Dietary Habits and Nutritional Risks How many meals a day?: 3 Eats fruit and vegetables daily?: (!) no Most meals are obtained by: preparing own meals In  the last 2 weeks, have you had any of the following?: (!) nausea, vomiting, diarrhea (threw up last night after eating happens atleast twice a month) Diabetic:: no  Functional Status Activities of Daily Living (to include ambulation/medication): Independent Ambulation: Independent Medication Administration: Independent Home Management: Independent Manage your own finances?: yes Primary transportation is: driving Concerns about vision?: no *vision screening is required for WTM* Concerns about hearing?: no  Fall Screening Falls in the past year?: 0 Number of falls in past year: 0 Was there an injury with Fall?: 0 Fall Risk Category Calculator: 0 Patient Fall Risk Level: Low Fall Risk  Fall Risk Patient at Risk for Falls Due to: No Fall Risks Fall risk Follow up: Falls evaluation completed  Home and Transportation Safety: All rugs have  non-skid backing?: yes All stairs or steps have railings?: yes Grab bars in the bathtub or shower?: (!) no Have non-skid surface in bathtub or shower?: yes Good home lighting?: yes Regular seat belt use?: yes Hospital stays in the last year:: no  Cognitive Assessment Difficulty concentrating, remembering, or making decisions? : no Will 6CIT or Mini Cog be Completed: no 6CIT or Mini Cog Declined: patient alert, oriented, able to answer questions appropriately and recall recent events  Advance Directives (For Healthcare) Does Patient Have a Medical Advance Directive?: No Would patient like information on creating a medical advance directive?: Yes (Inpatient - patient defers creating a medical advance directive at this time - Information given)  Reviewed/Updated  Reviewed/Updated: All        Objective:    Today's Vitals   05/14/24 0918  BP: 130/82  Pulse: 84  SpO2: 97%  Weight: 227 lb 6.4 oz (103.1 kg)  Height: 5' 5 (1.651 m)   Body mass index is 37.84 kg/m.  Current Medications (verified) Outpatient Encounter Medications as of 05/14/2024  Medication Sig   Cyanocobalamin (B-12) 5000 MCG CAPS Take by mouth.   famotidine (PEPCID) 20 MG tablet Take 1 tablet (20 mg total) by mouth 2 (two) times daily.   Ginkgo Biloba 40 MG TABS Take by mouth.   glucosamine-chondroitin 500-400 MG tablet Take 1 tablet by mouth 3 (three) times daily.   Gotu Kola, Centella asiatica, (GOTU KOLA PO) Take 1 capsule by mouth.   losartan -hydrochlorothiazide  (HYZAAR) 100-25 MG tablet Take 1 tablet by mouth daily.   Multiple Vitamins-Minerals (MULTIVITAMIN WITH MINERALS) tablet Take 1 tablet by mouth daily.   OVER THE COUNTER MEDICATION Lung and bronchial   OVER THE COUNTER MEDICATION optihear   rosuvastatin  (CRESTOR ) 40 MG tablet Take 1 tablet (40 mg total) by mouth daily.   Tiotropium Bromide-Olodaterol (STIOLTO RESPIMAT ) 2.5-2.5 MCG/ACT AERS INHALE 2 PUFFS BY MOUTH INTO THE LUNGS DAILY    [DISCONTINUED] losartan -hydrochlorothiazide  (HYZAAR) 50-12.5 MG tablet TAKE 1 TABLET BY MOUTH EVERY DAY   [DISCONTINUED] Ginger, Zingiber officinalis, (GINGER PO) Take 1,500 mg by mouth. (Patient not taking: Reported on 05/14/2024)   [DISCONTINUED] Magnesium 250 MG TABS Take by mouth. (Patient not taking: Reported on 04/09/2024)   [DISCONTINUED] NON FORMULARY Neuropaquell (Patient not taking: Reported on 04/09/2024)   No facility-administered encounter medications on file as of 05/14/2024.   Hearing/Vision screen No results found. Immunizations and Health Maintenance Health Maintenance  Topic Date Due   DEXA SCAN  Never done   Mammogram  03/12/2024   COVID-19 Vaccine (7 - 2025-26 season) 05/30/2024 (Originally 03/05/2024)   Colonoscopy  08/09/2029   DTaP/Tdap/Td (3 - Td or Tdap) 04/14/2031   Pneumococcal Vaccine: 50+ Years  Completed  Influenza Vaccine  Completed   Hepatitis C Screening  Completed   Zoster Vaccines- Shingrix   Completed   Meningococcal B Vaccine  Aged Out        Assessment/Plan:  This is a routine wellness examination for Jocelyn Bond.  Patient Care Team: Joyce Norleen BROCKS, MD as PCP - General (Family Medicine) Dann Candyce RAMAN, MD as PCP - Cardiology (Cardiology)  I have personally reviewed and noted the following in the patient's chart:   Medical and social history Use of alcohol, tobacco or illicit drugs  Current medications and supplements including opioid prescriptions. Functional ability and status Nutritional status Physical activity Advanced directives List of other physicians Hospitalizations, surgeries, and ER visits in previous 12 months Vitals Screenings to include cognitive, depression, and falls Referrals and appointments  Orders Placed This Encounter  Procedures   MM 3D SCREENING MAMMOGRAM BILATERAL BREAST    Standing Status:   Future    Expiration Date:   05/14/2025    Reason for Exam (SYMPTOM  OR DIAGNOSIS REQUIRED):   screening for  breast cancer    Preferred imaging location?:   [facility]   DG Bone Density    Standing Status:   Future    Expiration Date:   05/14/2025    Reason for Exam (SYMPTOM  OR DIAGNOSIS REQUIRED):   Screening    Preferred imaging location?:   [facility]   Flu vaccine HIGH DOSE PF(Fluzone Trivalent)   Comprehensive metabolic panel   CBC with Differential   Lipid Panel   HgB A1c   In addition, I have reviewed and discussed with patient certain preventive protocols, quality metrics, and best practice recommendations. A written personalized care plan for preventive services as well as general preventive health recommendations were provided to patient.   Jocelyn DELENA Bustle, MD   05/14/2024   No follow-ups on file.  After Visit Summary: (In Person-Printed) AVS printed and given to the patient  Nurse Notes: None

## 2024-05-14 NOTE — Progress Notes (Signed)
 Name: Jocelyn Bond   Date of Visit: 05/14/24   Date of last visit with me: Visit date not found   CHIEF COMPLAINT:  Chief Complaint  Patient presents with   Medicare Wellness    AWV. Swelling is both ankles and legs.        HPI:  Discussed the use of AI scribe software for clinical note transcription with the patient, who gave verbal consent to proceed.  History of Present Illness   Jocelyn Bond is a 69 year old female with hypertension who presents with swelling in her ankles.  She has persistent swelling in her ankles, with the right side being worse than the left. The swelling has been constant but has become more bothersome recently. She uses compression socks occasionally, including while sleeping, to help manage the swelling. No increase in urination frequency and no significant changes in water intake, although she acknowledges not drinking as much water as she should.  She has noticed light spots on her knuckles that appeared a few days ago. These spots are not itchy, and she was unaware of them until she saw them.  She experiences episodes of vomiting shortly after eating, occurring about once every two weeks. These episodes are sometimes preceded by a coughing spell. She does not currently take any medication for reflux.  Her current medication regimen includes a combination pill for blood pressure.         OBJECTIVE:       05/14/2024    9:16 AM  Depression screen PHQ 2/9  Decreased Interest 0  Down, Depressed, Hopeless 0  PHQ - 2 Score 0     BP Readings from Last 3 Encounters:  05/14/24 130/82  04/09/24 123/73  01/10/24 136/87    BP 130/82   Pulse 84   Ht 5' 5 (1.651 m)   Wt 227 lb 6.4 oz (103.1 kg)   SpO2 97%   BMI 37.84 kg/m    Physical Exam   EXTREMITIES: Right lower extremity edema      Physical Exam Constitutional:      Appearance: Normal appearance.  Cardiovascular:     Rate and Rhythm: Normal rate and regular rhythm.      Pulses: Normal pulses.     Heart sounds: Normal heart sounds.  Musculoskeletal:        General: No swelling, tenderness, deformity or signs of injury. Normal range of motion.     Right lower leg: Edema present.     Left lower leg: Edema present.  Neurological:     General: No focal deficit present.     Mental Status: She is alert and oriented to person, place, and time. Mental status is at baseline.     ASSESSMENT/PLAN:   Assessment & Plan Encounter for Medicare annual wellness exam  Flu vaccine need  Lower extremity edema  Essential hypertension  Hyperlipidemia, unspecified hyperlipidemia type  Postmenopausal estrogen deficiency  Visit for screening mammogram  Obesity (BMI 30-39.9)  Gastroesophageal reflux disease without esophagitis  Annual physical exam    Assessment and Plan    Adult Wellness Visit Routine wellness visit with focus on health maintenance, immunizations, and screenings. - Ordered basic labs. - Ordered bone density scan. - Ordered screening mammogram.  Immunization counseling and management Discussed COVID-19 and influenza vaccinations. COVID-19 vaccine reduces severity. Annual influenza vaccine recommended. - Recommended influenza vaccination.  Localized edema of bilateral lower extremities Chronic edema, right worse than left, likely due to antihypertensive regimen. Compression stockings may reduce edema. -  Increased hydrochlorothiazide  dose to 100 mg with 25 mg of other medication. - Advised use of compression stockings daily.  Essential hypertension Hypertension not optimally controlled. Blood pressure slightly elevated. Plan to adjust medication to improve control and reduce edema. - Increased hydrochlorothiazide  dose to 100 mg with 25 mg of other medication.  Gastroesophageal reflux disease Intermittent postprandial vomiting likely due to reflux, occurring biweekly. - Prescribed pepcid, one pill in the morning and one at night.          Jocelyn Bond A. Vita MD [facility]

## 2024-05-15 ENCOUNTER — Ambulatory Visit: Payer: Self-pay | Admitting: Family Medicine

## 2024-05-28 LAB — HM MAMMOGRAPHY

## 2024-06-15 ENCOUNTER — Other Ambulatory Visit: Payer: Self-pay | Admitting: Family Medicine

## 2024-06-15 DIAGNOSIS — J849 Interstitial pulmonary disease, unspecified: Secondary | ICD-10-CM

## 2024-07-09 NOTE — Progress Notes (Signed)
 "  Office Visit Note  Patient: Jocelyn Bond             Date of Birth: 11/19/1955           MRN: [id number]             PCP: Vita Morrow, MD Referring: Joyce Norleen BROCKS, MD Visit Date: 07/16/2024   Subjective:  Medical Management of Chronic Issues (Color changing spots on hands, and face.)   Discussed the use of AI scribe software for clinical note transcription with the patient, who gave verbal consent to proceed.  History of Present Illness: Jocelyn Bond is a 69 y.o. female here for follow up for MCTD with ILD, skin rashes, and elevated CK level.   She has been experiencing laryngitis for about a month, with a slight sore throat and occasional scratchiness, particularly when swallowing. No associated cough or shortness of breath is present, and she does not feel unwell overall.  She has noticed spots on her face, described as 'starting to spot up.' Her past medical history includes skin changes. Her past medical history includes skin discoloration, particularly on the face and knuckles.  She experiences occasional numbness in her hands, especially upon waking, which resolves quickly after getting out of bed. No recent illness or antibiotic use.  She has a history of Raynaud's phenomenon, with episodes of her fingers turning blue or white, even when indoors. She also reports swelling in her feet, which was previously managed by adjusting her blood pressure and fluid medication.   Previous HPI 04/09/2024 Jocelyn Bond is a 69 y.o. 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 Currently monitoring after stopping methotrexate  for concern about self injecting medication and side effects risk.   Over the past four weeks, she has experienced increased swelling in her feet and hands, particularly noticeable in her middle knuckles and fingers, which appear puffy but are not painful. The swelling in her feet decreases at night but  returns during the day.   She has a history of interstitial lung disease and has previously tried medications such as CellCept  and Imuran , which caused gastrointestinal side effects like nausea and vomiting. Methotrexate  was considered but she was uncomfortable with self-administering injections. She is currently not on these medications due to intolerance.   She reports frequent urination. No recent changes in weight or shortness of breath. She has been informed in the past that her leg veins are not functioning well, contributing to her chronic swelling, which has been present for years.   She mentions that she has been doing some walking and can elevate her feet on her sofa with pillows.       Previous HPI 01/10/2024 Jocelyn Bond is a 69 y.o. 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   She does not have a lot of current symptom complaints regarding skin or joints or respiratory symptoms. She experiences significant gastrointestinal upset, including vomiting with trying to start the CellCept  and mycophenolic acid.  She has tried various medications, including azathioprine  and CellCept , but was unable to tolerate them.   She is currently experiencing swelling in her right leg or foot, which is a recurring issue. The swelling does not cause significant pain and tends to improve after sleeping or using compression.   No recent joint pain, infections, or new illnesses since her last visit. She is not a regular drinker, only occasionally  consuming low-alcohol beverages like wine coolers.         Previous HPI 10/17/2023 Jocelyn Bond is a 69 y.o. 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     She is experiencing ongoing issues related to her connective tissue disease, primarily concerning her lung condition. A CT scan from February showed a mild increase in scarring compared to previous  imaging. Blood tests have shown high CK levels.   She has had difficulty tolerating medications prescribed for her condition. She initially tried Imuran , which caused significant gastrointestinal distress, including vomiting. After discontinuing Imuran , her vomiting resolved. She also tried CellCept  (mycophenolate ), which similarly caused gastrointestinal issues, leading to vomiting. Both medications were not well tolerated, and she is currently not on these treatments.   She experiences Raynaud's symptoms, such as her fingers turning white when exposed to cold, indicating circulation issues related to her connective tissue disease. No joint pain or stiffness. No skin issues on her hands or feet. No discoloration or other significant changes in her extremities.         Previous HPI 07/18/2023 Jocelyn Bond is a 69 y.o. 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   They report no new skin changes and deny any cough or shortness of breath, except for a brief coughing spell when rushing. They have not been sick with any upper respiratory infections recently. They have been trying to increase their physical activity by using a walking mat.   The patient's CK number, a muscle enzyme, has improved since the last visit. They were previously on mycophenolate , but it was discontinued due to adverse effects. The patient's neutrophils have been mildly low on previous CBC. TPMT level of 14 was normal.   Previous HPI 04/18/2023 Jocelyn Bond is a 69 y.o. 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  She started taking mycophenolate  500 mg twice daily initially without problem but for more than a week was experiencing persistent nausea vomiting and diarrhea.  She was not able to appreciate any difference in swelling rashes nodules or any respiratory complaint.  Lab  results from October 2 still showing elevated CK and ALT lab test.   Previous HPI 02/03/2023 Jocelyn Bond is a 69 y.o. 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  She has not this any particular changes with joint or muscle pain or weakness.  Still has skin rashes most prominent with raised bumps on both arms not particularly painful or itchy.  Use of topical steroids was not significantly effective.  She had follow-up in pulmonology clinic with PFTs consistent with mild restrictive and diffusion defect somewhat worse compared to 2022 study.   Previous HPI 12/23/2022 Jocelyn Bond is a 69 y.o. 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  Since our last visit she has not had major change in symptoms.  Skin rash on her arms has changed slightly with some raised bumps as well as the discolored areas mostly around the elbows.  These are not itchy or painful.  She also has a painful area on the right lateral thigh this only hurts on some days.  Does not cause associated hip or knee pain and she does not see any visible swelling.  No  radiation down to the foot.  Still sees intermittent discoloration and her fingers but less often with warm weather.  No particular cough or shortness of breath complaint.   Previous HPI 09/21/22 Jocelyn Bond is a 69 y.o. female here for follow up for systemic inflammatory symptoms and abnormal labs associated with interstitial lung disease.  It has been about 2 years since last follow-up she is doing well so did not pursue much additional workup or the recommended skin biopsy.  Repeat lung CT chest shows mild progression compared to 2022 imaging so was recommended probably needing to start treatment for her inflammatory lung disease.  She continues having mild amount of joint pain and stiffness in several areas but not seeing a lot of visible  swelling and not taking any medications for this.  Has not been experiencing any worsening trouble with acid reflux or dysphagia.  She still having issues of sometimes violaceous sometimes erythematous discoloration in her distal fingers with cold exposure.  Not associated with any nail changes or skin lesions.  She had an episode of rash across the back of her neck that resolved mostly on its own did use some over-the-counter topical cream.   Previous HPI 01/29/21 Jocelyn Bond is a 69 y.o. female here for evaluation for systemic connective tissue disease with positive antibodies and diagnosis of ILD.  She does seem to have a degree of chronic nonproductive cough but denies significant shortness of breath with exertion or at rest.  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  Current coughing has been noticeable since about 1 year ago.  She denies any particular complaints with joint pains or swelling.  She experiences some chronic rash on her distal arms and hands bilaterally that is controlled with topical treatment.  She experiences purple discoloration in the tips of the third finger of each hand with cold exposure that improved after rewarming.  She reports occasional choking on solid foods and having to regurgitate but this is infrequent.  She denies symptomatic acid reflux.  She experiences swelling of the right foot chronically states previous studies have indicated venous insufficiency around the level of the ankle without a particularly identified cause.  She denies any history of blood clots.  She also denies alopecia, oral ulcers, or photosensitive skin rashes.   Labs reviewed 11/2020 ANA 1:1280 cytoplasmic/reticular 1:1280 speckled Scl-70, SSA, SSb neg RF 40 CCP neg MyoMarker 3 Pl-12 weak pos, Ku weak pos, SSA 52kD 47, U1RNP 152, U2RNP  moderate pos ANCA neg CK 199     Review of Systems  Constitutional:  Negative for fatigue.  HENT:  Positive for sore throat and voice change. Negative for mouth sores and mouth dryness.   Eyes:  Negative for dryness.  Respiratory:  Negative for shortness of breath.   Cardiovascular:  Negative for chest pain and palpitations.  Gastrointestinal:  Negative for blood in stool, constipation and diarrhea.  Endocrine: Negative for increased urination.  Genitourinary:  Negative for involuntary urination.  Musculoskeletal:  Positive for joint swelling and muscle tenderness. Negative for joint pain, gait problem, joint pain, myalgias, muscle weakness, morning stiffness and myalgias.  Skin:  Positive for color change and hair loss. Negative for rash and sensitivity to sunlight.  Allergic/Immunologic: Negative for susceptible to infections.  Neurological:  Negative for dizziness and headaches.  Hematological:  Negative for swollen glands.  Psychiatric/Behavioral:  Negative for depressed mood  and sleep disturbance. The patient is not nervous/anxious.     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   High frequency hearing loss 06/30/2021   Aortic atherosclerosis 06/30/2021   Positive ANA (antinuclear antibody) 01/29/2021   ILD (interstitial lung disease) (HCC) 01/29/2021   High risk medication use 01/29/2021   Seborrheic keratosis 06/20/2019   Lymphadenopathy of head and neck 02/16/2016   Chronic venous insufficiency 12/11/2012   Obesity (BMI 30-39.9) 12/11/2012   Essential hypertension 01/09/2008    Past Medical History:  Diagnosis Date   Allergy    very rare    Hypertension    ILD (interstitial lung disease) (HCC)    Obesity     Family History  Problem Relation Age of Onset   Hypertension Mother    Hypertension Father    Hypertension Sister    Cancer Sister        breast ca    Hypertension Sister    Arthritis/Rheumatoid Sister    Hypertension Sister    Fibromyalgia Sister    Hypertension Sister    Healthy Son    Healthy Daughter    Healthy Daughter    Colon cancer Neg Hx    Colon polyps Neg Hx    Esophageal cancer Neg Hx    Rectal cancer Neg Hx    Stomach cancer Neg Hx    Past Surgical History:  Procedure Laterality Date   BTL     BUNIONECTOMY     COLONOSCOPY  2009   13 yrs ago Margarete- pt states removed polyps , no report, no path    LUNG BIOPSY  2009   TUBAL LIGATION     Social History   Social History Narrative   Not on file   Immunization History  Administered Date(s) Administered   Fluad Quad(high Dose 65+) 02/19/2022   INFLUENZA, HIGH DOSE SEASONAL PF 05/14/2024   Influenza,inj,Quad PF,6+ Mos 04/10/2018, 06/12/2020, 04/13/2021   Influenza-Unspecified 05/05/2016   Moderna Covid-19 Fall Seasonal Vaccine 78yrs & older 04/24/2023   Moderna Covid-19 Vaccine Bivalent Booster 75yrs & up 02/13/2021   Moderna Sars-Covid-2 Vaccination 09/13/2019, 10/16/2019, 06/09/2020   PNEUMOCOCCAL CONJUGATE-20 06/30/2021   Pfizer Covid-19 Vaccine Bivalent Booster 46yrs & up 02/19/2022   Respiratory Syncytial Virus Vaccine,Recomb Aduvanted(Arexvy) 07/16/2022   Tdap 08/06/2010, 04/13/2021   Zoster Recombinant(Shingrix ) 04/10/2018, 07/11/2018     Objective: Vital Signs: BP 122/70   Pulse 89   Temp 98.1 F (36.7 C)   Resp 16   Ht 5' 5 (1.651 m)   Wt 225 lb 9.6 oz (102.3 kg)   BMI 37.54 kg/m    Physical Exam Eyes:     Conjunctiva/sclera: Conjunctivae normal.  Cardiovascular:     Rate and Rhythm: Normal rate and regular rhythm.  Pulmonary:     Effort: Pulmonary effort is normal.     Breath sounds: Normal breath sounds.  Lymphadenopathy:     Cervical: No cervical adenopathy.  Skin:    General: Skin is warm and dry.     Comments: Left side of face poikiloderma 1+ pitting ankle edema Mild hyperpigmentation on dorsum MCP joints  Neurological:      Mental Status: She is alert.  Psychiatric:        Mood and Affect: Mood normal.      Musculoskeletal Exam:  Shoulders full ROM no tenderness or swelling Elbows full ROM no tenderness or swelling Wrists full ROM  no tenderness or swelling Fingers full ROM no tenderness, Skin thickening on fingers wthout definite synovitis Knees full ROM no tenderness or swelling Ankles full ROM no tenderness or swelling   Investigation: No additional findings.  Imaging: No results found.  Recent Labs: Lab Results  Component Value Date   WBC 9.6 05/14/2024   HGB 12.7 05/14/2024   PLT 233 05/14/2024   NA 139 05/14/2024   K 3.4 (L) 05/14/2024   CL 102 05/14/2024   CO2 26 05/14/2024   GLUCOSE 107 (H) 05/14/2024   BUN 9 05/14/2024   CREATININE 0.96 05/14/2024   BILITOT 0.4 05/14/2024   ALKPHOS 67 05/14/2024   AST 30 05/14/2024   ALT 20 05/14/2024   PROT 6.9 05/14/2024   ALBUMIN 3.1 (L) 05/14/2024   CALCIUM  8.9 05/14/2024   GFRAA 61 06/23/2020   QFTBGOLDPLUS NEGATIVE 01/29/2021    Speciality Comments: No specialty comments available.  Procedures:  No procedures performed Allergies: Patient has no known allergies.   Assessment / Plan:     Visit Diagnoses: MCTD (mixed connective tissue disease) - Plan: Sedimentation rate, CK Features include interstitial lung disease, Raynaud's phenomenon, myositis, and poikiloderma. Symptoms mild with no significant inflammation markers. No aggressive treatment needed currently. - Rechecked CK levels to assess muscle inflammation. - Consider rituximab infusion if symptoms worsen or lung CT shows progression.  Interstitial lung disease due to connective tissue disease Secondary to mixed connective tissue disease. No recent respiratory symptoms. Lung CT not recently performed so unclear if continued progression.  Raynaud's phenomenon Episodes of fingers turning blue or white. No recent exacerbations.  Myositis Elevated CK levels indicate muscle  inflammation. Current levels abnormal but lower than previous. Numbness in hands reported. - Recheck CK levels to monitor muscle inflammation.  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 No significant cosmetic concern.       Orders: Orders Placed This Encounter  Procedures   Sedimentation rate   CK   No orders of the defined types were placed in this encounter.    Follow-Up Instructions: Return in about 6 months (around 01/13/2025) for MCTD obs f/u 6mos.   Lonni LELON Ester, MD  Note - This record has been created using [employer].  Chart creation errors have been sought, but may not always  have been located. Such creation errors do not reflect on  the standard of medical care. "

## 2024-07-16 ENCOUNTER — Ambulatory Visit: Attending: Internal Medicine | Admitting: Internal Medicine

## 2024-07-16 ENCOUNTER — Encounter: Payer: Self-pay | Admitting: Internal Medicine

## 2024-07-16 VITALS — BP 122/70 | HR 89 | Temp 98.1°F | Resp 16 | Ht 65.0 in | Wt 225.6 lb

## 2024-07-16 DIAGNOSIS — R600 Localized edema: Secondary | ICD-10-CM

## 2024-07-16 DIAGNOSIS — M351 Other overlap syndromes: Secondary | ICD-10-CM

## 2024-07-16 NOTE — Patient Instructions (Signed)
 SABRA

## 2024-07-17 ENCOUNTER — Ambulatory Visit: Admitting: Family Medicine

## 2024-07-17 LAB — CK: Total CK: 170 U/L (ref 20–243)

## 2024-07-17 LAB — SEDIMENTATION RATE: Sed Rate: 14 mm/h (ref 0–30)

## 2024-07-24 ENCOUNTER — Ambulatory Visit: Admitting: Family Medicine

## 2024-07-24 ENCOUNTER — Encounter: Payer: Self-pay | Admitting: Family Medicine

## 2024-07-24 VITALS — BP 124/72 | HR 90 | Wt 226.8 lb

## 2024-07-24 DIAGNOSIS — R0982 Postnasal drip: Secondary | ICD-10-CM | POA: Diagnosis not present

## 2024-07-24 DIAGNOSIS — M7989 Other specified soft tissue disorders: Secondary | ICD-10-CM | POA: Diagnosis not present

## 2024-07-24 MED ORDER — FUROSEMIDE 20 MG PO TABS: 20.0000 mg | ORAL_TABLET | Freq: Every day | ORAL | 3 refills | Status: AC | PRN

## 2024-07-24 MED ORDER — FLUTICASONE PROPIONATE 50 MCG/ACT NA SUSP: 2.0000 | Freq: Every day | NASAL | 6 refills | Status: AC

## 2024-07-24 NOTE — Progress Notes (Signed)
" ° °  Name: Jocelyn Bond   Date of Visit: 07/24/24   Date of last visit with me: 06/15/2024   CHIEF COMPLAINT:  Chief Complaint  Patient presents with   Acute Visit    Laryngitis going on for at least 6 weeks started mid December, feet swelling going on for a while.        HPI:  Discussed the use of AI scribe software for clinical note transcription with the patient, who gave verbal consent to proceed.  History of Present Illness   Jocelyn Bond is a 69 year old female who presents with sore throat and foot swelling.  She has been experiencing a sore throat and laryngitis for the past six weeks. The sore throat is mild and seems to improve as the day progresses. No significant nasal congestion, but she mentions blowing her nose daily. She does not use any nasal sprays currently.  She reports chronic foot swelling, which she describes as related to 'insufficiency things in my feet,' and has been dealing with this issue for years. She occasionally wears compression stockings but often forgets to do so.  She mentions not consuming pork frequently, only during holidays or cookouts.         OBJECTIVE:       05/14/2024    9:16 AM  Depression screen PHQ 2/9  Decreased Interest 0  Down, Depressed, Hopeless 0  PHQ - 2 Score 0     BP Readings from Last 3 Encounters:  07/24/24 124/72  07/16/24 122/70  05/14/24 130/82    BP 124/72   Pulse 90   Wt 226 lb 12.8 oz (102.9 kg)   SpO2 98%   BMI 37.74 kg/m    Physical Exam   EXTREMITIES: Edema present in feet.      Physical Exam Constitutional:      Appearance: Normal appearance.  Musculoskeletal:        General: Swelling present. No tenderness or signs of injury.     Right lower leg: Edema present.     Left lower leg: Edema present.  Neurological:     General: No focal deficit present.     Mental Status: She is alert and oriented to person, place, and time. Mental status is at baseline.     ASSESSMENT/PLAN:    Assessment & Plan Post-nasal drip  Foot swelling    Assessment and Plan    Postnasal drip Mild sore throat from postnasal drip, worsens at night, improves during the day. - Use Flonase  nasal spray nightly for winter. - Use a humidifier in the room.  Chronic lower extremity edema Chronic foot swelling due to venous insufficiency, inconsistent use of compression stockings. - Prescribed Lasix  (furosemide ) as needed for swelling. - Instructed to take Lasix  and monitor urine output; if no increase, take additional dose. - Encouraged consistent use of compression stockings.         Jarquis Walker A. Vita MD [facility] "

## 2024-07-24 NOTE — Patient Instructions (Signed)
 VISIT SUMMARY:  During your visit, we discussed your sore throat and foot swelling. You have been experiencing a sore throat and laryngitis for the past six weeks, which seems to improve as the day progresses. Additionally, you have chronic foot swelling that you have been dealing with for years.  YOUR PLAN:  -POSTNASAL DRIP: Postnasal drip occurs when excess mucus from the nose drips down the back of the throat, causing irritation. To help with your sore throat, you should use Flonase  nasal spray nightly during the winter and use a humidifier in your room.  -CHRONIC LOWER EXTREMITY EDEMA: Chronic lower extremity edema is swelling in the lower legs and feet due to poor blood flow. You have been prescribed Lasix  (furosemide ) to take as needed for swelling. Monitor your urine output after taking Lasix ; if there is no increase, take an additional dose. It is important to consistently use your compression stockings to help manage the swelling.

## 2024-10-15 ENCOUNTER — Ambulatory Visit: Admitting: Internal Medicine

## 2025-05-14 ENCOUNTER — Encounter: Admitting: Family Medicine
